# Patient Record
Sex: Female | Born: 1940 | Race: Black or African American | Hispanic: No | Marital: Single | Smoking: Never smoker
Health system: Southern US, Community
[De-identification: ages and names within clinical notes are randomized; demographics above are authoritative.]

## PROBLEM LIST (undated history)

## (undated) DIAGNOSIS — I10 Essential (primary) hypertension: Secondary | ICD-10-CM

## (undated) DIAGNOSIS — M1990 Unspecified osteoarthritis, unspecified site: Secondary | ICD-10-CM

## (undated) DIAGNOSIS — C801 Malignant (primary) neoplasm, unspecified: Secondary | ICD-10-CM

## (undated) DIAGNOSIS — E079 Disorder of thyroid, unspecified: Secondary | ICD-10-CM

## (undated) DIAGNOSIS — D849 Immunodeficiency, unspecified: Secondary | ICD-10-CM

## (undated) DIAGNOSIS — N289 Disorder of kidney and ureter, unspecified: Secondary | ICD-10-CM

## (undated) HISTORY — DX: Unspecified osteoarthritis, unspecified site: M19.90

## (undated) HISTORY — PX: ABDOMINAL HYSTERECTOMY: SHX81

## (undated) HISTORY — PX: OTHER SURGICAL HISTORY: SHX169

---

## 2003-08-09 ENCOUNTER — Other Ambulatory Visit: Payer: Self-pay

## 2004-09-28 ENCOUNTER — Ambulatory Visit: Payer: Self-pay

## 2005-02-01 ENCOUNTER — Other Ambulatory Visit: Payer: Self-pay

## 2005-02-08 ENCOUNTER — Ambulatory Visit: Payer: Self-pay | Admitting: Orthopaedic Surgery

## 2005-02-23 ENCOUNTER — Encounter: Payer: Self-pay | Admitting: Orthopaedic Surgery

## 2005-03-05 ENCOUNTER — Encounter: Payer: Self-pay | Admitting: Orthopaedic Surgery

## 2005-03-27 ENCOUNTER — Ambulatory Visit: Payer: Self-pay | Admitting: Internal Medicine

## 2005-03-29 ENCOUNTER — Ambulatory Visit: Payer: Self-pay | Admitting: Internal Medicine

## 2005-04-05 ENCOUNTER — Encounter: Payer: Self-pay | Admitting: Orthopaedic Surgery

## 2005-05-05 ENCOUNTER — Encounter: Payer: Self-pay | Admitting: Orthopaedic Surgery

## 2005-06-05 ENCOUNTER — Encounter: Payer: Self-pay | Admitting: Orthopaedic Surgery

## 2005-07-06 ENCOUNTER — Encounter: Payer: Self-pay | Admitting: Orthopaedic Surgery

## 2005-07-20 ENCOUNTER — Encounter: Payer: Self-pay | Admitting: Internal Medicine

## 2005-08-03 ENCOUNTER — Encounter: Payer: Self-pay | Admitting: Internal Medicine

## 2005-09-03 ENCOUNTER — Encounter: Payer: Self-pay | Admitting: Internal Medicine

## 2005-10-03 ENCOUNTER — Encounter: Payer: Self-pay | Admitting: Internal Medicine

## 2005-11-03 ENCOUNTER — Encounter: Payer: Self-pay | Admitting: Internal Medicine

## 2005-12-03 ENCOUNTER — Encounter: Payer: Self-pay | Admitting: Internal Medicine

## 2005-12-22 ENCOUNTER — Ambulatory Visit: Payer: Self-pay | Admitting: Urology

## 2006-01-02 ENCOUNTER — Encounter: Payer: Self-pay | Admitting: Urology

## 2006-01-03 ENCOUNTER — Encounter: Payer: Self-pay | Admitting: Urology

## 2006-01-03 ENCOUNTER — Encounter: Payer: Self-pay | Admitting: Internal Medicine

## 2006-02-03 ENCOUNTER — Encounter: Payer: Self-pay | Admitting: Urology

## 2006-02-03 ENCOUNTER — Emergency Department: Payer: Self-pay | Admitting: Emergency Medicine

## 2006-02-03 ENCOUNTER — Other Ambulatory Visit: Payer: Self-pay

## 2006-02-06 ENCOUNTER — Ambulatory Visit: Payer: Self-pay | Admitting: Urology

## 2006-03-01 ENCOUNTER — Other Ambulatory Visit: Payer: Self-pay

## 2006-03-01 ENCOUNTER — Inpatient Hospital Stay: Payer: Self-pay | Admitting: Internal Medicine

## 2006-03-02 ENCOUNTER — Other Ambulatory Visit: Payer: Self-pay

## 2006-03-08 ENCOUNTER — Ambulatory Visit: Payer: Self-pay | Admitting: Cardiovascular Disease

## 2006-03-22 ENCOUNTER — Inpatient Hospital Stay: Payer: Self-pay | Admitting: Internal Medicine

## 2006-03-23 ENCOUNTER — Other Ambulatory Visit: Payer: Self-pay

## 2006-04-30 ENCOUNTER — Ambulatory Visit: Payer: Self-pay | Admitting: Nephrology

## 2006-06-04 ENCOUNTER — Other Ambulatory Visit: Payer: Self-pay

## 2006-06-04 ENCOUNTER — Inpatient Hospital Stay: Payer: Self-pay | Admitting: Internal Medicine

## 2006-06-05 ENCOUNTER — Other Ambulatory Visit: Payer: Self-pay

## 2006-06-06 ENCOUNTER — Other Ambulatory Visit: Payer: Self-pay

## 2006-06-07 ENCOUNTER — Other Ambulatory Visit: Payer: Self-pay

## 2006-07-06 ENCOUNTER — Encounter: Payer: Self-pay | Admitting: Internal Medicine

## 2006-08-23 ENCOUNTER — Encounter: Payer: Self-pay | Admitting: Internal Medicine

## 2006-09-04 ENCOUNTER — Encounter: Payer: Self-pay | Admitting: Internal Medicine

## 2006-10-04 ENCOUNTER — Encounter: Payer: Self-pay | Admitting: Internal Medicine

## 2006-11-04 ENCOUNTER — Encounter: Payer: Self-pay | Admitting: Internal Medicine

## 2006-12-09 ENCOUNTER — Emergency Department: Payer: Self-pay | Admitting: Emergency Medicine

## 2007-04-17 ENCOUNTER — Observation Stay: Payer: Self-pay | Admitting: Internal Medicine

## 2007-04-17 ENCOUNTER — Other Ambulatory Visit: Payer: Self-pay

## 2007-04-18 ENCOUNTER — Other Ambulatory Visit: Payer: Self-pay

## 2007-05-21 ENCOUNTER — Ambulatory Visit: Payer: Self-pay | Admitting: Internal Medicine

## 2007-06-20 ENCOUNTER — Ambulatory Visit: Payer: Self-pay

## 2007-06-21 ENCOUNTER — Ambulatory Visit: Payer: Self-pay

## 2008-05-24 IMAGING — CR DG CHEST 1V PORT
1 series · 1 of 1 positions shown · non-contrast
Comparison: none

REASON FOR EXAM: CP
COMMENTS:

[view not recorded]
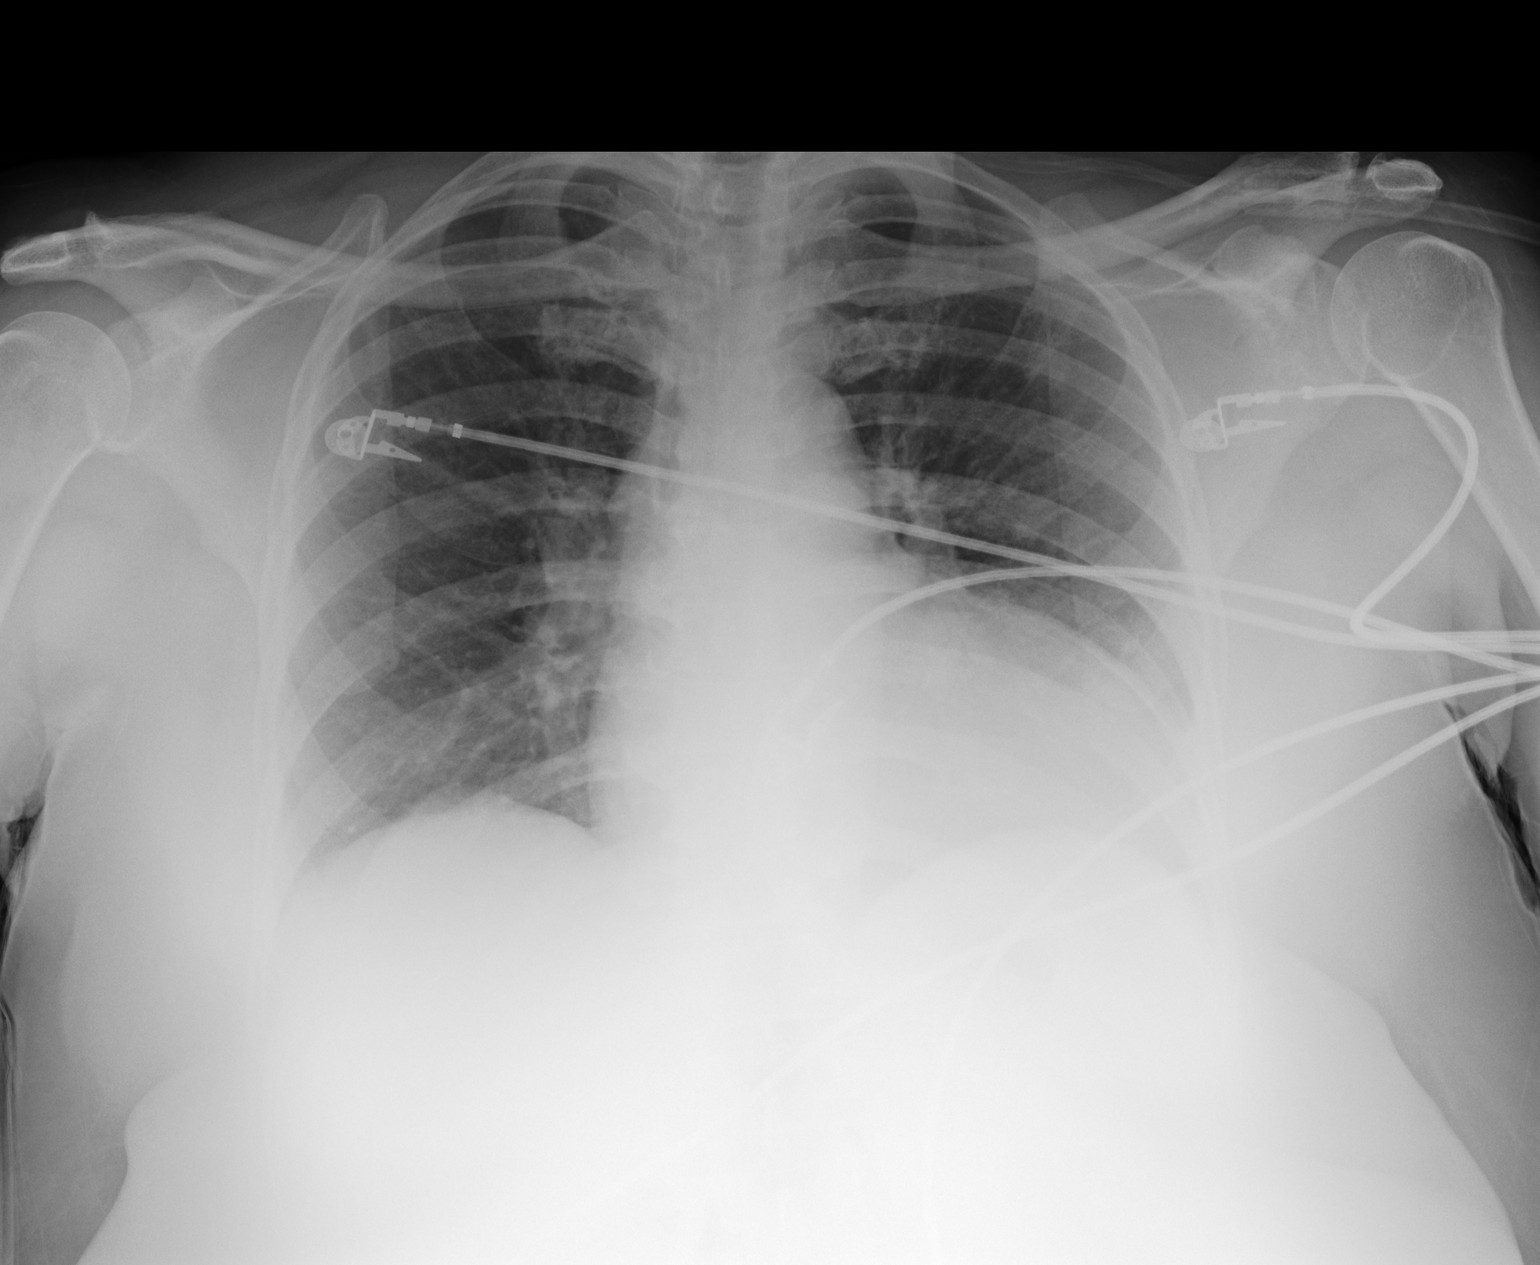

[1 of 1 positions shown; findings below may reference images not displayed]

PROCEDURE:     DXR - DXR PORTABLE CHEST SINGLE VIEW  - March 01, 2006 [DATE]

RESULT:     Portable AP view of the chest is compared to the prior exam of
02-08-05.  The lung fields are clear. No pneumonia, pneumothorax or pleural
effusion is seen.  No pulmonary edema is noted.  The heart is upper limits
to normal in size.  Monitoring electrodes are present.
IMPRESSION: 1)No acute changes are identified.

## 2008-06-18 IMAGING — NM NM LUNG SCAN
2 series · 16 of 16 positions shown · non-contrast
Comparison: none

REASON FOR EXAM: hypoxia
COMMENTS:

PROCEDURE:     NM  - NM VQ LUNG SCAN  - [DATE] [DATE] [DATE]  [DATE]
RESULT:
HISTORY: Hypoxia.
PROCEDURE AND FINDINGS:  Following administration of 37.35 mCi of
technetium-33m DPTA and 4.95 mCi of technetium-33m MAA, ventilation
perfusion scan was obtained.  No definite ventilation perfusion mismatches
are noted.  Chest x-ray is unremarkable except for cardiomegaly.

[Series 1: lung perfusion · 1.95mm/px · 4 acquisitions, 8 frames shown]
[im 1/4]
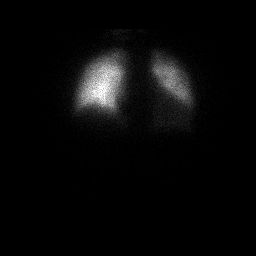
[im 1/4]
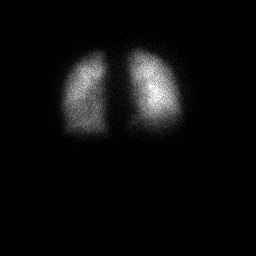
[im 2/4]
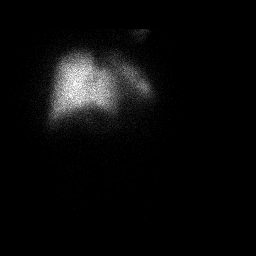
[im 2/4]
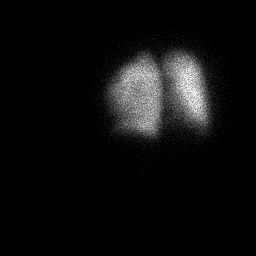
[im 3/4]
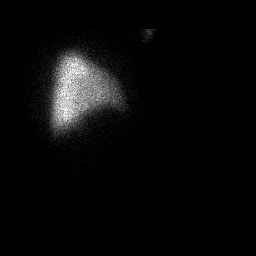
[im 3/4]
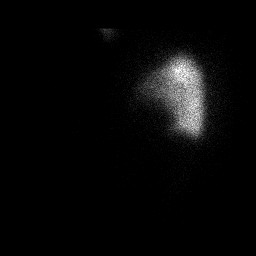
[im 4/4]
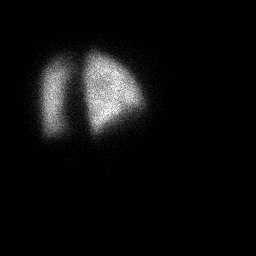
[im 4/4]
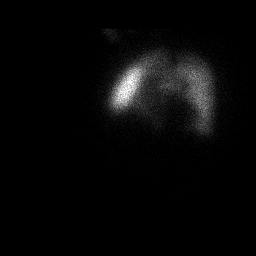

[Series 1: lung ventilation · 3.90mm/px · 4 acquisitions, 8 frames shown]
[im 1/4]
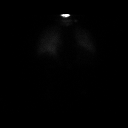
[im 1/4]
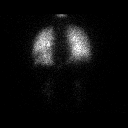
[im 2/4]
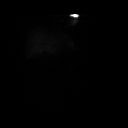
[im 2/4]
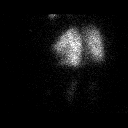
[im 3/4]
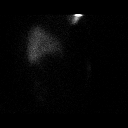
[im 3/4]
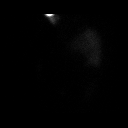
[im 4/4]
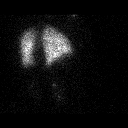
[im 4/4]
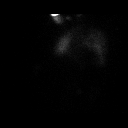

[16 of 16 positions shown; findings below may reference images not displayed]

IMPRESSION: 1)Low probability for pulmonary embolus.

This report was phoned to the patient's nurse. The patient's nurse was
instructed to inform Dr. Stellar.

## 2009-10-22 ENCOUNTER — Observation Stay: Payer: Self-pay | Admitting: Internal Medicine

## 2009-10-22 ENCOUNTER — Ambulatory Visit: Payer: Self-pay | Admitting: Cardiovascular Disease

## 2014-05-16 ENCOUNTER — Emergency Department: Payer: Self-pay | Admitting: Internal Medicine

## 2014-05-16 LAB — CBC
HCT: 33.4 % — ABNORMAL LOW (ref 35.0–47.0)
HGB: 10.9 g/dL — ABNORMAL LOW (ref 12.0–16.0)
MCH: 30.1 pg (ref 26.0–34.0)
MCHC: 32.6 g/dL (ref 32.0–36.0)
MCV: 93 fL (ref 80–100)
Platelet: 162 10*3/uL (ref 150–440)
RBC: 3.61 10*6/uL — ABNORMAL LOW (ref 3.80–5.20)
RDW: 14.1 % (ref 11.5–14.5)
WBC: 8.1 10*3/uL (ref 3.6–11.0)

## 2014-05-16 LAB — COMPREHENSIVE METABOLIC PANEL
ALBUMIN: 3.3 g/dL — AB (ref 3.4–5.0)
ANION GAP: 7 (ref 7–16)
AST: 25 U/L (ref 15–37)
Alkaline Phosphatase: 92 U/L
BUN: 23 mg/dL — ABNORMAL HIGH (ref 7–18)
Bilirubin,Total: 0.3 mg/dL (ref 0.2–1.0)
Calcium, Total: 8.8 mg/dL (ref 8.5–10.1)
Chloride: 104 mmol/L (ref 98–107)
Co2: 27 mmol/L (ref 21–32)
Creatinine: 1.69 mg/dL — ABNORMAL HIGH (ref 0.60–1.30)
EGFR (African American): 38 — ABNORMAL LOW
GFR CALC NON AF AMER: 31 — AB
GLUCOSE: 108 mg/dL — AB (ref 65–99)
Osmolality: 280 (ref 275–301)
Potassium: 4.2 mmol/L (ref 3.5–5.1)
SGPT (ALT): 29 U/L
Sodium: 138 mmol/L (ref 136–145)
Total Protein: 7.5 g/dL (ref 6.4–8.2)

## 2014-05-16 LAB — TROPONIN I: TROPONIN-I: 0.03 ng/mL

## 2014-09-08 ENCOUNTER — Emergency Department: Admit: 2014-09-08 | Disposition: A | Discharge status: Home / Self Care | Payer: Self-pay | Admitting: Student

## 2014-09-08 LAB — COMPREHENSIVE METABOLIC PANEL
ALBUMIN: 4.3 g/dL
ANION GAP: 8 (ref 7–16)
Alkaline Phosphatase: 85 U/L
BUN: 30 mg/dL — ABNORMAL HIGH
Bilirubin,Total: 0.3 mg/dL
CALCIUM: 9.1 mg/dL
CO2: 32 mmol/L
Chloride: 99 mmol/L — ABNORMAL LOW
Creatinine: 1.51 mg/dL — ABNORMAL HIGH
EGFR (Non-African Amer.): 34 — ABNORMAL LOW
GFR CALC AF AMER: 39 — AB
Glucose: 99 mg/dL
POTASSIUM: 3.9 mmol/L
SGOT(AST): 78 U/L — ABNORMAL HIGH
SGPT (ALT): 108 U/L — ABNORMAL HIGH
Sodium: 139 mmol/L
Total Protein: 7.8 g/dL

## 2014-09-08 LAB — CBC
HCT: 36.5 % (ref 35.0–47.0)
HGB: 12.3 g/dL (ref 12.0–16.0)
MCH: 30.2 pg (ref 26.0–34.0)
MCHC: 33.6 g/dL (ref 32.0–36.0)
MCV: 90 fL (ref 80–100)
Platelet: 143 10*3/uL — ABNORMAL LOW (ref 150–440)
RBC: 4.06 10*6/uL (ref 3.80–5.20)
RDW: 14.5 % (ref 11.5–14.5)
WBC: 4 10*3/uL (ref 3.6–11.0)

## 2014-09-08 LAB — URINALYSIS, COMPLETE
BILIRUBIN, UR: NEGATIVE
BLOOD: NEGATIVE
Glucose,UR: NEGATIVE mg/dL (ref 0–75)
KETONE: NEGATIVE
Leukocyte Esterase: NEGATIVE
Nitrite: NEGATIVE
PH: 7 (ref 4.5–8.0)
Protein: NEGATIVE
RBC,UR: NONE SEEN /HPF (ref 0–5)
SPECIFIC GRAVITY: 1.003 (ref 1.003–1.030)
Squamous Epithelial: NONE SEEN
WBC UR: <1 /HPF (ref 0–5)

## 2014-09-08 LAB — PROTIME-INR
INR: 0.9
PROTHROMBIN TIME: 12.8 s

## 2014-09-08 LAB — LIPASE, BLOOD: LIPASE: 76 U/L — AB

## 2014-09-08 LAB — TROPONIN I: Troponin-I: <0.03 ng/mL

## 2014-09-13 LAB — CULTURE, BLOOD (SINGLE)

## 2015-10-01 ENCOUNTER — Encounter: Payer: Self-pay | Admitting: *Deleted

## 2015-10-01 ENCOUNTER — Emergency Department: Payer: Medicare Other

## 2015-10-01 ENCOUNTER — Emergency Department
Admission: EM | Admit: 2015-10-01 | Discharge: 2015-10-02 | Disposition: A | Discharge status: Home / Self Care | Payer: Medicare Other | Attending: Emergency Medicine | Admitting: Emergency Medicine

## 2015-10-01 DIAGNOSIS — I10 Essential (primary) hypertension: Secondary | ICD-10-CM | POA: Insufficient documentation

## 2015-10-01 DIAGNOSIS — M545 Low back pain: Secondary | ICD-10-CM | POA: Diagnosis present

## 2015-10-01 DIAGNOSIS — R52 Pain, unspecified: Secondary | ICD-10-CM

## 2015-10-01 DIAGNOSIS — M5442 Lumbago with sciatica, left side: Secondary | ICD-10-CM | POA: Insufficient documentation

## 2015-10-01 HISTORY — DX: Disorder of kidney and ureter, unspecified: N28.9

## 2015-10-01 HISTORY — DX: Essential (primary) hypertension: I10

## 2015-10-01 HISTORY — DX: Malignant (primary) neoplasm, unspecified: C80.1

## 2015-10-01 HISTORY — DX: Immunodeficiency, unspecified: D84.9

## 2015-10-01 HISTORY — DX: Disorder of thyroid, unspecified: E07.9

## 2015-10-01 LAB — CBC WITH DIFFERENTIAL/PLATELET
BASOS ABS: 0 10*3/uL (ref 0–0.1)
BASOS PCT: 1 %
Eosinophils Absolute: 0 10*3/uL (ref 0–0.7)
Eosinophils Relative: 0 %
HEMATOCRIT: 33 % — AB (ref 35.0–47.0)
HEMOGLOBIN: 11.1 g/dL — AB (ref 12.0–16.0)
Lymphocytes Relative: 29 %
Lymphs Abs: 1.4 10*3/uL (ref 1.0–3.6)
MCH: 30.4 pg (ref 26.0–34.0)
MCHC: 33.5 g/dL (ref 32.0–36.0)
MCV: 90.7 fL (ref 80.0–100.0)
Monocytes Absolute: 0.7 10*3/uL (ref 0.2–0.9)
Monocytes Relative: 16 %
NEUTROS ABS: 2.6 10*3/uL (ref 1.4–6.5)
NEUTROS PCT: 54 %
Platelets: 152 10*3/uL (ref 150–440)
RBC: 3.64 MIL/uL — ABNORMAL LOW (ref 3.80–5.20)
RDW: 14.5 % (ref 11.5–14.5)
WBC: 4.7 10*3/uL (ref 3.6–11.0)

## 2015-10-01 MED ORDER — ACETAMINOPHEN 325 MG PO TABS
650.0000 mg | ORAL_TABLET | Freq: Once | ORAL | Status: AC
  Administered 2015-10-01: 650 mg via ORAL
  Filled 2015-10-01: qty 2

## 2015-10-01 MED ORDER — HYDROMORPHONE HCL 1 MG/ML IJ SOLN
0.5000 mg | Freq: Once | INTRAMUSCULAR | Status: AC
  Administered 2015-10-01: 0.5 mg via INTRAVENOUS
  Filled 2015-10-01: qty 1

## 2015-10-01 MED ORDER — ONDANSETRON HCL 4 MG/2ML IJ SOLN
4.0000 mg | Freq: Once | INTRAMUSCULAR | Status: AC
  Administered 2015-10-01: 4 mg via INTRAVENOUS
  Filled 2015-10-01: qty 2

## 2015-10-01 NOTE — ED Notes (Signed)
Pt c/o L lower back and L hip pain radiating down L leg. Pt states worsening over past 4 days. Pt states hx of sciatic pain once prior. Pt states unable to take NSAID's secondary to kidney disease. Pt c/o swelling in L ankle and lower leg. Pt denies relief from tylenol at this time. Pt states unable to lie down and sleep due to pain.

## 2015-10-01 NOTE — ED Provider Notes (Signed)
[facility] Emergency Department Provider Note  ____________________________________________  Time seen: Approximately 10:45 PM  I have reviewed the triage vital signs and the nursing notes.   HISTORY  Chief Complaint Back Pain    HPI Stacey Hayes is a 75 y.o. female who reports she's had lower back pain in the past she's had twinges for several weeks but in the last approximately week she's had constant low back pain radiating down the left leg. Last 2 days has been very severe and she is having trouble moving the left leg out of the bed when she gets up in the morning.   Past Medical History  Diagnosis Date  . Renal disorder   . Hypertension   . Cancer ([place])   . Thyroid disease   . Immune deficiency disorder ([place])     There are no active problems to display for this patient.   Past Surgical History  Procedure Laterality Date  . Abdominal hysterectomy    . Lung cyst Left     Current Outpatient Rx  Name  Route  Sig  Dispense  Refill  . HYDROcodone-acetaminophen (NORCO) 5-325 MG tablet   Oral   Take 1-2 tablets by mouth every 6 (six) hours as needed for moderate pain (Start with one tablet if that does not work after half an hour he can take a second tablet and then due to tablets every 6 hours).   6 tablet   0     Allergies Beta adrenergic blockers  History reviewed. No pertinent family history.  Social History Social History  Substance Use Topics  . Smoking status: Never Smoker   . Smokeless tobacco: None  . Alcohol Use: No    Review of Systems  Constitutional: No fever/chills Eyes: No visual changes. ENT: No sore throat. Cardiovascular: Denies chest pain. Respiratory: Denies shortness of breath. Gastrointestinal: No abdominal pain.  No nausea, no vomiting.  No diarrhea.  No constipation. Genitourinary: Negative for dysuria. Musculoskeletal: Negative for back pain. Skin: Negative for rash. Neurological: Negative for  headaches, focal weakness   10-point ROS otherwise negative.  ____________________________________________   PHYSICAL EXAM:  VITAL SIGNS: ED Triage Vitals  Enc Vitals Group     BP 10/01/15 2119 141/73 mmHg     Pulse Rate 10/01/15 2119 77     Resp 10/01/15 2119 20     Temp 10/01/15 2119 98.4 F (36.9 C)     Temp Source 10/01/15 2119 Oral     SpO2 10/01/15 2119 99 %     Weight 10/01/15 2119 197 lb (89.359 kg)     Height 10/01/15 2119 5\' 5"  (1.651 m)     Head Cir --      Peak Flow --      Pain Score 10/01/15 2120 9     Pain Loc --      Pain Edu? --      Excl. in [place]? --     Constitutional: Alert and oriented. Well appearing and in no acute distress. Eyes: Conjunctivae are normal. PERRL. EOMI. Head: Atraumatic. Nose: No congestion/rhinnorhea. Mouth/Throat: Mucous membranes are moist.  Oropharynx non-erythematous. Neck: No stridor.   Cardiovascular: Normal rate, regular rhythm. Grossly normal heart sounds.  Good peripheral circulation. Respiratory: Normal respiratory effort.  No retractions. Lungs CTAB. Gastrointestinal: Soft and nontender. No distention. No abdominal bruits. No CVA tenderness. Musculoskeletal: No lower extremity tenderness nor edema.  No joint effusions. Neurologic:  Normal speech and language. NoDefinite gross focal neurologic deficits are appreciated.  Skin:  Skin is warm, dry and intact. No rash noted. Psychiatric: Mood and affect are normal. Speech and behavior are normal.  ____________________________________________   LABS (all labs ordered are listed, but only abnormal results are displayed)  Labs Reviewed  COMPREHENSIVE METABOLIC PANEL - Abnormal; Notable for the following:    Glucose, Bld 132 (*)    BUN 30 (*)    Creatinine, Ser 1.91 (*)    GFR calc non Af Amer 25 (*)    GFR calc Af Amer 28 (*)    All other components within normal limits  CBC WITH DIFFERENTIAL/PLATELET - Abnormal; Notable for the following:    RBC 3.64 (*)    Hemoglobin  11.1 (*)    HCT 33.0 (*)    All other components within normal limits  URINALYSIS COMPLETEWITH MICROSCOPIC ([facility] ONLY)   ____________________________________________  EKG   ____________________________________________  RADIOLOGY  CLINICAL DATA: Low back pain radiating to LEFT lower extremity, worsening for 4 days. History of hypertension, renal disorder, immune deficiency disorder.  EXAM: MRI LUMBAR SPINE WITHOUT CONTRAST  TECHNIQUE: Multiplanar, multisequence MR imaging of the lumbar spine was performed. No intravenous contrast was administered.  COMPARISON: CT abdomen and pelvis September 08, 2014  FINDINGS: Lumbar vertebral bodies intact. Using the reference level of the last well-formed intervertebral disc as L5-S1, stable grade 1 L4-5 anterolisthesis. No spondylolysis. Maintenance of lumbar lordosis. Mild L3-4 through L5-S1 disc height loss, with decreased T2 signal within all lumbar disc compatible with mild desiccation. Moderate chronic discogenic endplate changes [id number]. No STIR signal abnormality to suggest acute osseous process.  Conus medullaris terminates at L1-2 and appears normal morphology and signal characteristics. Cauda equina is normal. Included prevertebral and paraspinal soft tissues are nonsuspicious. Partially imaged cyst RIGHT kidney better characterized on recent CT.  Level by level evaluation:  T12-L1: No disc bulge, canal stenosis or neural foraminal narrowing. Moderate RIGHT facet arthropathy.  L1-2: No disc bulge. Mild to moderate LEFT greater than RIGHT facet arthropathy without canal stenosis or neural foraminal narrowing.  L2-3: No disc bulge. Moderate facet arthropathy and ligamentum flavum redundancy without canal stenosis or neural foraminal narrowing.  L3-4: Small broad-based disc bulge. Moderate to severe facet arthropathy and ligamentum flavum redundancy result in mild canal stenosis. Mild RIGHT neural foraminal  narrowing.  L4-5: Anterolisthesis. Small broad-based disc bulge. Moderate facet arthropathy and ligamentum flavum redundancy. Mild canal stenosis. No significant neural foraminal narrowing.  L5-S1: Moderate broad-based disc bulge. Mild facet arthropathy and ligamentum flavum redundancy. No canal stenosis. Moderate RIGHT, moderate to severe LEFT neural foraminal narrowing.  IMPRESSION: No acute process of the lumbar spine.  Stable grade 1 L4-5 anterolisthesis on degenerative basis.  Mild canal stenosis L3-4 and L4-5. Neural foraminal narrowing L3-4 and L5-S1: Moderate to severe on the LEFT at L5-S1.   Electronically Signed  By: Elon Alas M.D.  On: 10/02/2015 00:30       ____________________________________________   PROCEDURES    ____________________________________________   INITIAL IMPRESSION / ASSESSMENT AND PLAN / ED COURSE  Pertinent labs & imaging results that were available during my care of the patient were reviewed by me and considered in my medical decision making (see chart for details).  Oral instructions include be careful for constipation and weakness or dizziness from the medicine do not fall. ____________________________________________   FINAL CLINICAL IMPRESSION(S) / ED DIAGNOSES  Final diagnoses:  Pain  Left-sided low back pain with left-sided sciatica      Nena Polio, MD 10/02/15 0127

## 2015-10-01 NOTE — ED Notes (Addendum)
Patient transported to MRI 

## 2015-10-02 LAB — COMPREHENSIVE METABOLIC PANEL
ALBUMIN: 3.9 g/dL (ref 3.5–5.0)
ALT: 17 U/L (ref 14–54)
AST: 23 U/L (ref 15–41)
Alkaline Phosphatase: 76 U/L (ref 38–126)
Anion gap: 13 (ref 5–15)
BUN: 30 mg/dL — ABNORMAL HIGH (ref 6–20)
CHLORIDE: 101 mmol/L (ref 101–111)
CO2: 25 mmol/L (ref 22–32)
CREATININE: 1.91 mg/dL — AB (ref 0.44–1.00)
Calcium: 9.1 mg/dL (ref 8.9–10.3)
GFR calc non Af Amer: 25 mL/min — ABNORMAL LOW (ref >60)
GFR, EST AFRICAN AMERICAN: 28 mL/min — AB (ref >60)
Glucose, Bld: 132 mg/dL — ABNORMAL HIGH (ref 65–99)
Potassium: 4.5 mmol/L (ref 3.5–5.1)
SODIUM: 139 mmol/L (ref 135–145)
Total Bilirubin: 0.5 mg/dL (ref 0.3–1.2)
Total Protein: 7.1 g/dL (ref 6.5–8.1)

## 2015-10-02 MED ORDER — HYDROCODONE-ACETAMINOPHEN 5-325 MG PO TABS: 1.0000 | ORAL_TABLET | Freq: Four times a day (QID) | ORAL | Status: AC | PRN

## 2015-10-02 NOTE — ED Notes (Signed)
Patient transported returned MRI

## 2015-10-02 NOTE — ED Notes (Signed)
PIV DC'D, CATH INTACT.

## 2015-11-09 ENCOUNTER — Encounter: Payer: Self-pay | Admitting: Physical Therapy

## 2015-11-09 ENCOUNTER — Ambulatory Visit: Payer: Medicare Other | Attending: Geriatric Medicine | Admitting: Physical Therapy

## 2015-11-09 DIAGNOSIS — R262 Difficulty in walking, not elsewhere classified: Secondary | ICD-10-CM

## 2015-11-09 DIAGNOSIS — M6281 Muscle weakness (generalized): Secondary | ICD-10-CM

## 2015-11-09 DIAGNOSIS — R293 Abnormal posture: Secondary | ICD-10-CM | POA: Diagnosis present

## 2015-11-09 DIAGNOSIS — M5442 Lumbago with sciatica, left side: Secondary | ICD-10-CM | POA: Diagnosis present

## 2015-11-09 NOTE — Therapy (Signed)
[place] MAIN [facility] SERVICES [address] [place], [place], [address] Phone: [phone]   Fax:  [phone]  Physical Therapy Evaluation  Patient Details  Name: Stacey Hayes MRN: [id number] Date of Birth: [birth year] Referring Provider: Johny Blamer  Encounter Date: 11/09/2015      PT End of Session - 11/09/15 1538    Visit Number 1   Number of Visits 13   Date for PT Re-Evaluation 12/21/15   Authorization Type G code 1    Authorization Time Period 10   PT Start Time 1300   PT Stop Time 1402   PT Time Calculation (min) 62 min   Activity Tolerance Patient tolerated treatment well;No increased pain   Behavior During Therapy [facility] for tasks assessed/performed      Past Medical History  Diagnosis Date  . Renal disorder   . Thyroid disease   . Immune deficiency disorder ([place])   . Cancer ([place])     5-6 years previous, lung cancer  . Hypertension     fluctuating, treated with meds  . Arthritis     knees    Past Surgical History  Procedure Laterality Date  . Abdominal hysterectomy    . Lung cyst Left     There were no vitals filed for this visit.       Subjective Assessment - 11/09/15 1308    Subjective Patient reports falling backwards in April and that LBP began 3 weeks after fall. Patient reports having difficulties getting out of bed with pain in lower back and radiating anteriorly to BLE, all the way to LLE foot, towards ankle on RLE.Marland Kitchen Patient reports inability to walk and lay supine 3 weeks after fall resulting in an ED visit.    Pertinent History Personal factors affecting rehab: Living alone, high fall risk, chronic back pain, arthritis, no railing on step to enter home,    Limitations Standing;Walking;Sitting   How long can you sit comfortably? 30 min.; if for an hour have problems moving afterwards   How long can you walk comfortably? reports not leaving the house unless she absolutely has to    Diagnostic tests MRI  Mild canal stenosis L3-4 and L4-5. Neural foraminal narrowing L3-4 and L5-S1: Moderate to severe on the LEFT at L5-S1.   Patient Stated Goals Wants to be able to travel near/far distances such as being able to sit in car without increase in aggravating symptoms;    Currently in Pain? Yes   Pain Score 6    Pain Location Back   Pain Orientation Left;Lower   Pain Descriptors / Indicators Throbbing;Nagging   Pain Type Chronic pain   Pain Radiating Towards BLE (L>R)   Pain Onset More than a month ago   Pain Frequency Intermittent   Aggravating Factors  being stationary, lying down, standing   Pain Relieving Factors Ice, movement helps            [facility] PT Assessment - 11/09/15 0001    Assessment   Medical Diagnosis Low Back Pain with LLE radiculopathy   Referring Provider Joella Prince, John   Onset Date/Surgical Date 09/04/15   Hand Dominance Right   Next MD Visit August   Prior Therapy had PT years ago for back pain with good results, no recent therapy for this condition   Precautions   Precautions Fall   Restrictions   Weight Bearing Restrictions No   Balance Screen   Has the patient fallen in the past 6 months Yes  How many times? 1   Has the patient had a decrease in activity level because of a fear of falling?  Yes   Is the patient reluctant to leave their home because of a fear of falling?  No   Home Environment   Additional Comments 2 story home, lives alone, 1 step in back with no railings, bedroom and bathroom on main level, steps in the house which patient reports using only on "good days", no grab bars in the tub, elevated toilet seat, still driving, does some cooking and light house work   Prior Function   Level of Interior and spatial designer;Independent with gait;Independent with transfers   Vocation Retired   [employer], involved in a Seniors group, social interactions, cooking   Cognition   Overall Cognitive Status Within Functional Limits for tasks assessed    Observation/Other Assessments   Observations Flexion increases pain and radiates into L hip and waistline; Lateral flexion limited (L>R), increased pain with back extension. Pain on L/R rotation with >increase rotating to the L.Repeated extensions in prone seemed to decrease LLE pain but increased lumbar back pain.   Sensation   Light Touch Appears Intact   Additional Comments denies N/T   Coordination   Gross Motor Movements are Fluid and Coordinated Yes   Fine Motor Movements are Fluid and Coordinated Yes   Posture/Postural Control   Posture Comments Sitting-slumped with decreased thoracic kyphosis, forward flexed in sitting and standing, exhibits less lumbar lordosis    AROM   Lumbar Flexion 45   Lumbar Extension 15   Lumbar - Right Side Bend 5   Lumbar - Left Side Bend 10   Strength   Right Hip Flexion 3/5   Right Hip Extension 2/5   Left Hip Flexion 3/5   Left Hip Extension 2/5   Right Knee Flexion 4-/5   Right Knee Extension 4/5   Left Knee Flexion 3+/5   Left Knee Extension 3+/5   Right Ankle Dorsiflexion 4/5   Left Ankle Dorsiflexion 4/5   Palpation   Spinal mobility Grossly hypomobile L1-L5 with centralized point tenderness     Palpation comment Tender to palpation in center of back (L1-5), increased pain in LLE with palpation of L lower back (multifidus region); piriformis palpation: trigger point on L, tightness over L paraspinals and L multifidus   Slump test   Findings Positive   Comment BLE (L>R)   Prone Knee Bend Test   Findings Positive   Comment BLE   Transfers   Comments Uses arm rests   Ambulation/Gait   Gait Comments Ambulate without AD, demonstrates decreased trunk rotation, arm swing and knee flexion BLE. slow gait speed   High Level Balance   High Level Balance Comments Able to stand unsupported, Good -        Treatment  HEP Initated Prone lumbar extensions 3 sets x 10 reps  Standing lumbar extension with wall assist 1 set x 10  reps Instruction on sitting posture with lumbar roll                     PT Education - 11/09/15 1537    Education provided Yes   Education Details HEP initiated, educated patient on extension, posture, lumbar roll    Person(s) Educated Patient   Methods Explanation;Demonstration;Verbal cues;Handout   Comprehension Verbalized understanding;Returned demonstration             PT Long Term Goals - 11/09/15 1726    PT LONG TERM  GOAL #1   Title Pt will report worst pain of 3/10 in low back to increase functional activites    Time 6   Period Weeks   Status New   PT LONG TERM GOAL #2   Title Pt will be indep picking up object off the floor with good body mechanics and without an increase in pain    Time 6   Period Weeks   Status New   PT LONG TERM GOAL #3   Title Pt will report decreased modified oswestry score to less than 20 percent to improve functional mobility    Time 6   Period Weeks   Status New   PT LONG TERM GOAL #4   Title Pt will be able to ride in car for 2+ hours without increase in pain in order to travel to see family    Time 6   Period Weeks   Status New   PT LONG TERM GOAL #5   Title Pt will increase BLE grossly to 4/5 without increase in pain for easier sit to stand transfer    Time 6   Period Weeks   Status New               Plan - 11/09/15 1541    Clinical Impression Statement Pt is 75 year old female with chronic low back pain radiating to LLE moreso than RLE after fall in April.  Pt experiences increased LBP after sitting for long periods of time, standing for long periods of time or bending over to pick things off the floor.  She lives alone in a two  story home but resides on the first floor. The back pain has limited her community activity. MRI shows Mild canal stenosis L3-4 and L4-5. Neural foraminal narrowing L3-4 and L5-S1: Moderate to severe on the LEFT at L5-S1. Pt has limited lumbar ROM in all directions which results in  increased pain.  Pt is tender over spinous processes of L1-L5 with tightness over L paraspinals, multifidus, and pirformis.  She reported decreased LLE pain and more centralized LBP with repeated extensions.  She would benefit from further skilled physical therapy to decrease LBP, increase ROM, and stability and strength of low back.     Rehab Potential Fair   Clinical Impairments Affecting Rehab Potential Positive indicator: motivated, negative indicator: age, comorbidites, chronic back pain, radicular symptoms, fall risk, clinical presentation: evolving-due to radicular symptoms related to back pain    PT Frequency 2x / week   PT Duration 6 weeks   PT Treatment/Interventions ADLs/Self Care Home Management;Cryotherapy;Electrical Stimulation;Moist Heat;Traction;Ultrasound;Stair training;Functional mobility training;Gait training;Therapeutic exercise;Therapeutic activities;Balance training;Neuromuscular re-education;Patient/family education;Manual techniques;Energy conservation;Aquatic Therapy   PT Next Visit Plan modifed oswestry, lumbar ROM, strengthening    PT Home Exercise Plan initated, see patient instructions    Consulted and Agree with Plan of Care Patient      Patient will benefit from skilled therapeutic intervention in order to improve the following deficits and impairments:  Pain, Hypomobility, Decreased endurance, Decreased balance, Decreased activity tolerance, Decreased strength, Decreased range of motion, Decreased mobility, Postural dysfunction, Abnormal gait, Improper body mechanics, Decreased safety awareness  Visit Diagnosis: Bilateral low back pain with left-sided sciatica - Plan: PT plan of care cert/re-cert  Muscle weakness (generalized) - Plan: PT plan of care cert/re-cert  Difficulty in walking, not elsewhere classified - Plan: PT plan of care cert/re-cert  Abnormal posture - Plan: PT plan of care cert/re-cert      G-Codes - [id number]  Functional Assessment  Tool Used strength, ROM, clinical judgement    Functional Limitation Mobility: Walking and moving around   Mobility: Walking and Moving Around Current Status [phone]) At least 40 percent but less than 60 percent impaired, limited or restricted   Mobility: Walking and Moving Around Goal Status [phone]) At least 20 percent but less than 40 percent impaired, limited or restricted       Problem List There are no active problems to display for this patient.   Lexa Coronado, PT, DPT 11/09/2015, 5:40 PM  [place] MAIN [facility] SERVICES [address] [place], [place], [address] Phone: [phone]   Fax:  [phone]  Name: Stacey Hayes MRN: [id number] Date of Birth: 1941/01/30

## 2015-11-09 NOTE — Patient Instructions (Signed)
On Elbows (Prone)    Rise up on elbows as high as possible, keeping hips on floor. Hold _2___ seconds. Repeat __10__ times per set. Do __2__ sets per session. Do __every hour as tolerated;  http://orth.exer.us/93   Copyright  VHI. All rights reserved.  Extension: Lumbar - Standing (Belt)    Standing, arch backwards (either facing a wall or hands on hips)Stay in pain free range. Repeat _10___ times per set. Do __2__ sets per session. Do __every hour as available.  Copyright  VHI. All rights reserved.  Posture - Sitting    Sit upright, head facing forward. Try using a roll to support lower back. Keep shoulders relaxed, and avoid rounded back. Keep hips level with knees. Avoid crossing legs for long periods. Put a towel roll against low back for good posture;   Copyright  VHI. All rights reserved.

## 2015-11-11 ENCOUNTER — Encounter: Payer: Self-pay | Admitting: Physical Therapy

## 2015-11-11 ENCOUNTER — Ambulatory Visit: Payer: Medicare Other | Admitting: Physical Therapy

## 2015-11-11 DIAGNOSIS — M5442 Lumbago with sciatica, left side: Secondary | ICD-10-CM

## 2015-11-11 DIAGNOSIS — R293 Abnormal posture: Secondary | ICD-10-CM

## 2015-11-11 DIAGNOSIS — M6281 Muscle weakness (generalized): Secondary | ICD-10-CM

## 2015-11-11 DIAGNOSIS — R262 Difficulty in walking, not elsewhere classified: Secondary | ICD-10-CM

## 2015-11-11 NOTE — Therapy (Signed)
[place] MAIN [facility] SERVICES [address] [place], [place], [address] Phone: [phone]   Fax:  [phone]  Physical Therapy Treatment  Patient Details  Name: Stacey Hayes MRN: [id number] Date of Birth: Jan 17, 1941 Referring Provider: Johny Blamer  Encounter Date: 11/11/2015      PT End of Session - 11/11/15 1406    Visit Number 2   Number of Visits 13   Date for PT Re-Evaluation 12/21/15   Authorization Type G code 2   Authorization Time Period 10   PT Start Time [id number]   PT Stop Time [id number]   PT Time Calculation (min) 50 min   Activity Tolerance No increased pain;Patient limited by pain   Behavior During Therapy [facility] for tasks assessed/performed;Anxious      Past Medical History  Diagnosis Date  . Renal disorder   . Thyroid disease   . Immune deficiency disorder ([place])   . Cancer ([place])     5-6 years previous, lung cancer  . Hypertension     fluctuating, treated with meds  . Arthritis     knees    Past Surgical History  Procedure Laterality Date  . Abdominal hysterectomy    . Lung cyst Left     There were no vitals filed for this visit.      Subjective Assessment - 11/11/15 1316    Subjective Pt reports her whole body is sore today, and that her back was 9/10 this morning and closer to 7/10 at the beginning of treatment.  Pt reports doing prone press ups at home but unable to complete them every hour.  Pt also reported that she did not go to choir practice this morning due to her back pain   Pertinent History Personal factors affecting rehab: Living alone, high fall risk, chronic back pain, arthritis, no railing on step to enter home,    Limitations Standing;Walking;Sitting   How long can you sit comfortably? 30 min.; if for an hour have problems moving afterwards   How long can you walk comfortably? reports not leaving the house unless she absolutely has to    Diagnostic tests MRI Mild canal stenosis L3-4 and L4-5. Neural  foraminal narrowing L3-4 and L5-S1: Moderate to severe on the LEFT at L5-S1.   Patient Stated Goals Wants to be able to travel near/far distances such as being able to sit in car without increase in aggravating symptoms;    Currently in Pain? Yes   Pain Score 7    Pain Location Back   Pain Orientation Left;Mid   Pain Descriptors / Indicators Tightness;Sore   Pain Type Chronic pain   Pain Onset More than a month ago            [facility] PT Assessment - 11/11/15 0001    Observation/Other Assessments   Modified Oswertry 56%-severe disability, higher percentage indicates greater disability       Began session with pt completing Modified Oswestry outcome measure   Treatment   Prone press ups 4 sets x 10 reps, 3rd and 4th set on hands rather than elbow, with increased VCs to increase lumbar extension   Grade 2 PA's to L1-L5 for  3 second bouts at each segment for 10 mins. Pt demonstrated muscle guarding and hypomobility . Pt noted soreness but not pain.   Attempted long axis manual distraction to L LE for 10 seconds for treatment of pain relief but patient was unable to tolerate with increased pain after 10 seconds.  Also attempted supine sciatic nerve glide, pt presented with muscle guarding and complaints of increased pain. Distraction and nerve glides deferred.     Lumbar rotations x 30 to the R and back to midline with min VCs for greater ROM   Transverse Abdominis Marches x 20 with mod VCs for core stabilization and L hip flexion range   Standing hip extension with support of counter x 10 reps with min VCs for knee extension and upright posture   Nustep for 4 mins with hot pack application (untimed charge)   Reviewed HEP to continue prone press ups, initiated lumbar rotations to the R, and standing hip extensions with counter support.  Pt expressed verbal understanding and proper demonstration                        PT Education - 11/11/15 1403    Education  provided Yes   Education Details HEP, posture, positioning for prone press ups, walking and completing HEP daily    Person(s) Educated Patient   Methods Explanation;Demonstration;Verbal cues;Handout   Comprehension Verbalized understanding;Verbal cues required             PT Long Term Goals - 11/09/15 1726    PT LONG TERM GOAL #1   Title Pt will report worst pain of 3/10 in low back to increase functional activites    Time 6   Period Weeks   Status New   PT LONG TERM GOAL #2   Title Pt will be indep picking up object off the floor with good body mechanics and without an increase in pain    Time 6   Period Weeks   Status New   PT LONG TERM GOAL #3   Title Pt will report decreased modified oswestry score to less than 20 percent to improve functional mobility    Time 6   Period Weeks   Status New   PT LONG TERM GOAL #4   Title Pt will be able to ride in car for 2+ hours without increase in pain in order to travel to see family    Time 6   Period Weeks   Status New   PT LONG TERM GOAL #5   Title Pt will increase BLE grossly to 4/5 without increase in pain for easier sit to stand transfer    Time 6   Period Weeks   Status New               Plan - 11/11/15 1407    Clinical Impression Statement Pt continues to report back pain and pain into her L posterior thigh that is limiting her community activity.  She reports some relief with prone press ups but notes soreness in lower back and arms.  Pt reported pressure rather than pain in posterior L thigh after prone press ups.  Pts pain was aggravated by sciatic nerve glides, PA's to lumbar spine, and manual traction of LLE.  Pt seemed to have some relief with lumbar rotations in hooklying and tranverse abdominis marches.  Pt was able to perform standing hip extensions with min verbal cues for posture and knee extension throughout.  Pt would continue to benefit from skilled physical therapy to decrease pain, decrease hypomobility  and increase strengthening of core and lumbar musculature.     Rehab Potential Fair   Clinical Impairments Affecting Rehab Potential Positive indicator: motivated, negative indicator: age, comorbidites, chronic back pain, radicular symptoms, fall risk, clinical presentation: evolving-due to  radicular symptoms related to back pain    PT Frequency 2x / week   PT Duration 6 weeks   PT Treatment/Interventions ADLs/Self Care Home Management;Cryotherapy;Electrical Stimulation;Moist Heat;Traction;Ultrasound;Stair training;Functional mobility training;Gait training;Therapeutic exercise;Therapeutic activities;Balance training;Neuromuscular re-education;Patient/family education;Manual techniques;Energy conservation;Aquatic Therapy   PT Next Visit Plan Nustep, review of HEP, core strengthening, extension preference exercises    PT Home Exercise Plan continued, see patient instructions    Consulted and Agree with Plan of Care Patient      Patient will benefit from skilled therapeutic intervention in order to improve the following deficits and impairments:  Pain, Hypomobility, Decreased endurance, Decreased balance, Decreased activity tolerance, Decreased strength, Decreased range of motion, Decreased mobility, Postural dysfunction, Abnormal gait, Improper body mechanics, Decreased safety awareness  Visit Diagnosis: Bilateral low back pain with left-sided sciatica  Muscle weakness (generalized)  Abnormal posture  Difficulty in walking, not elsewhere classified     Problem List There are no active problems to display for this patient.  Stacy Gardner, SPT  Trotter,Margaret PT, DPT 11/11/2015, 4:48 PM  [place] MAIN [facility] SERVICES [address] [place], [place], [address] Phone: [phone]   Fax:  [phone]  Name: Stacey Hayes MRN: [id number] Date of Birth: [birth year]

## 2015-11-11 NOTE — Patient Instructions (Addendum)
   Copyright  VHI. All rights reserved.   Lower Trunk Rotation Stretch  Lying on back with knees bent, Keeping back flat and feet together, rotate knees side to side slowly and in pain free range of motion. (ROTATE TO RIGHT SIDE AND THEN BACK TO MIDDLE. AVOID GOING TO LEFT SIDE)Hold _2___ seconds. Repeat for 1-2 minutes. Do __1__ sets per session. Do __2-3__ sessions per day.   Copyright  VHI. All rights reserved.   EXTENSION: Standing (Active)    Stand, both feet flat. Draw right leg behind body as far as possible. Use __0_ lbs. Complete __2_ sets of _10__ repetitions. Perform __2_ sessions per day.  http://gtsc.exer.us/77   Copyright  VHI. All rights reserved.  Back Hyperextension: Using Arms    Lying face down with arms bent, inhale. Then while exhaling, straighten arms. (push through hands to increase ease of pushing up) Hold __2__ seconds. Slowly return to starting position. Repeat _10___ times per set. Do _2___ sets per session. Do __2__ sessions per day.  Copyright  VHI. All rights reserved.

## 2015-11-15 ENCOUNTER — Ambulatory Visit: Payer: Medicare Other | Admitting: Physical Therapy

## 2015-11-18 ENCOUNTER — Encounter: Payer: Self-pay | Admitting: Physical Therapy

## 2015-11-18 ENCOUNTER — Ambulatory Visit: Payer: Medicare Other | Admitting: Physical Therapy

## 2015-11-18 DIAGNOSIS — R262 Difficulty in walking, not elsewhere classified: Secondary | ICD-10-CM

## 2015-11-18 DIAGNOSIS — M5442 Lumbago with sciatica, left side: Secondary | ICD-10-CM

## 2015-11-18 DIAGNOSIS — M6281 Muscle weakness (generalized): Secondary | ICD-10-CM

## 2015-11-18 DIAGNOSIS — R293 Abnormal posture: Secondary | ICD-10-CM

## 2015-11-18 NOTE — Therapy (Signed)
[place] MAIN [facility] SERVICES [address] [place], [place], [address] Phone: [phone]   Fax:  [phone]  Physical Therapy Treatment  Patient Details  Name: Stacey Hayes MRN: [id number] Date of Birth: 1941-01-08 Referring Provider: Johny Blamer  Encounter Date: 11/18/2015      PT End of Session - 11/18/15 1556    Visit Number 3   Number of Visits 13   Date for PT Re-Evaluation 12/21/15   Authorization Type G code 3   Authorization Time Period 10   PT Start Time [id number]   PT Stop Time [id number]   PT Time Calculation (min) 38 min   Activity Tolerance No increased pain;Patient limited by pain   Behavior During Therapy [facility] for tasks assessed/performed;Anxious      Past Medical History  Diagnosis Date  . Renal disorder   . Thyroid disease   . Immune deficiency disorder ([place])   . Cancer ([place])     5-6 years previous, lung cancer  . Hypertension     fluctuating, treated with meds  . Arthritis     knees    Past Surgical History  Procedure Laterality Date  . Abdominal hysterectomy    . Lung cyst Left     There were no vitals filed for this visit.      Subjective Assessment - 11/18/15 1553    Subjective Pt reports pain in her neck and knees today and that her back was actually feeling ok.  She reports falling on the cement in the dark last night after being locked out of her house, as a result her knees were scraped up and her neck is sore.     Pertinent History Personal factors affecting rehab: Living alone, high fall risk, chronic back pain, arthritis, no railing on step to enter home,    Limitations Standing;Walking;Sitting   How long can you sit comfortably? 30 min.; if for an hour have problems moving afterwards   How long can you walk comfortably? reports not leaving the house unless she absolutely has to    Diagnostic tests MRI Mild canal stenosis L3-4 and L4-5. Neural foraminal narrowing L3-4 and L5-S1: Moderate to severe on  the LEFT at L5-S1.   Patient Stated Goals Wants to be able to travel near/far distances such as being able to sit in car without increase in aggravating symptoms;    Currently in Pain? Yes   Pain Score 8    Pain Location Back   Pain Orientation Left   Pain Type Chronic pain   Pain Onset More than a month ago       Treatment   Transverse abdominis marches in supine x 20 BLEs, min VCs for eccentric control  Pelvic rocks x 20 reps in hooklying, min tactile cues for increased ROM Hamstring AAROM stretch 3 sets x 20 seconds BLEs, min verbal cues for breathing technique  Lumbar rotations in hooklying to the R and back to midline x 20 reps  Prone press ups deferred due to pt complaints of shoulder discomfort  Inferential TENs to lumbar spine in sitting for 15 mins at tolerated intensity concurrent with cryotherapy, pt reported decrease in lumbar pain after TENs  HEP reviewed with addition of hamstring stretch and pelvic rocks                          PT Education - 11/18/15 1556    Education provided Yes   Education Details  addition of pelvic rocks and hamstring stretch to HEP, icing, importance of walking    Person(s) Educated Patient   Methods Explanation;Verbal cues   Comprehension Verbalized understanding;Returned demonstration;Verbal cues required             PT Long Term Goals - 11/09/15 1726    PT LONG TERM GOAL #1   Title Pt will report worst pain of 3/10 in low back to increase functional activites    Time 6   Period Weeks   Status New   PT LONG TERM GOAL #2   Title Pt will be indep picking up object off the floor with good body mechanics and without an increase in pain    Time 6   Period Weeks   Status New   PT LONG TERM GOAL #3   Title Pt will report decreased modified oswestry score to less than 20 percent to improve functional mobility    Time 6   Period Weeks   Status New   PT LONG TERM GOAL #4   Title Pt will be able to ride in car  for 2+ hours without increase in pain in order to travel to see family    Time 6   Period Weeks   Status New   PT LONG TERM GOAL #5   Title Pt will increase BLE grossly to 4/5 without increase in pain for easier sit to stand transfer    Time 6   Period Weeks   Status New               Plan - 11/18/15 1601    Clinical Impression Statement Pt had more overall pain today after her fall yesterday.  Pt was able to perform supine tranverse abdominis marches without an increase in pain. Pt performed pelvic tilts with min tactile cues for full ROM, which were added to her HEP.  Pt tolerated supine AAROM hamstring stretch without pain.  Pt reported that supine lumbar rotations were more comfortable on treatment table rather than her bed at home.  Pt did not tolerate prone positon well, inferential TENS was used in seated position with ice, pt reported a decrease in back pain after treatment.  She would continue to benefit from skilled physical therapy to increase lumbar range, core stability and decrease pain for greater functional mobility.     Rehab Potential Fair   Clinical Impairments Affecting Rehab Potential Positive indicator: motivated, negative indicator: age, comorbidites, chronic back pain, radicular symptoms, fall risk, clinical presentation: evolving-due to radicular symptoms related to back pain    PT Frequency 2x / week   PT Duration 6 weeks   PT Treatment/Interventions ADLs/Self Care Home Management;Cryotherapy;Electrical Stimulation;Moist Heat;Traction;Ultrasound;Stair training;Functional mobility training;Gait training;Therapeutic exercise;Therapeutic activities;Balance training;Neuromuscular re-education;Patient/family education;Manual techniques;Energy conservation;Aquatic Therapy   PT Next Visit Plan Nustep, review of HEP, core strengthening, extension preference exercises, TENs,    PT Home Exercise Plan see pt instructions, hamstring stretch and pelvic tilts were added     Consulted and Agree with Plan of Care Patient      Patient will benefit from skilled therapeutic intervention in order to improve the following deficits and impairments:  Pain, Hypomobility, Decreased endurance, Decreased balance, Decreased activity tolerance, Decreased strength, Decreased range of motion, Decreased mobility, Postural dysfunction, Abnormal gait, Improper body mechanics, Decreased safety awareness  Visit Diagnosis: Bilateral low back pain with left-sided sciatica  Muscle weakness (generalized)  Abnormal posture  Difficulty in walking, not elsewhere classified     Problem List There are  no active problems to display for this patient.  Stacy Gardner, SPT  This entire session was performed under direct supervision and direction of a licensed therapist/therapist assistant . I have personally read, edited and approve of the note as written.  Trotter,Margaret PT, DPT 11/18/2015, 4:20 PM  [place] MAIN [facility] SERVICES [address] [place], [place], [address] Phone: [phone]   Fax:  [phone]  Name: DANENE SANTAMARINA MRN: [id number] Date of Birth: Aug 30, 1940

## 2015-11-18 NOTE — Patient Instructions (Signed)
(  Home) Flexion: Pelvic Tilt    Lie with neck supported, knees bent, feet flat. Tighten and suck stomach in, pushing back down against surface. Do not push down with legs. Repeat _20___ times per set. Do 1____ sets per session. Do __5__ sessions per week.  Copyright  VHI. All rights reserved.  Hamstring Step 1    Straighten left knee. Keep knee level with other knee or on bolster. Hold __30_ seconds. Relax knee by returning foot to start. Repeat _3_ times. Perform on each leg   Copyright  VHI. All rights reserved.

## 2015-11-22 ENCOUNTER — Encounter: Payer: Self-pay | Admitting: Physical Therapy

## 2015-11-22 ENCOUNTER — Ambulatory Visit: Payer: Medicare Other | Admitting: Physical Therapy

## 2015-11-22 DIAGNOSIS — R262 Difficulty in walking, not elsewhere classified: Secondary | ICD-10-CM

## 2015-11-22 DIAGNOSIS — M5442 Lumbago with sciatica, left side: Secondary | ICD-10-CM

## 2015-11-22 DIAGNOSIS — R293 Abnormal posture: Secondary | ICD-10-CM

## 2015-11-22 DIAGNOSIS — M6281 Muscle weakness (generalized): Secondary | ICD-10-CM

## 2015-11-22 NOTE — Therapy (Signed)
[place] MAIN [facility] SERVICES [address] [place], [place], [address] Phone: [phone]   Fax:  [phone]  Physical Therapy Treatment  Patient Details  Name: Stacey Hayes MRN: [id number] Date of Birth: 1940-11-08 Referring Provider: Johny Blamer  Encounter Date: 11/22/2015      PT End of Session - 11/22/15 1514    Visit Number 4   Number of Visits 13   Date for PT Re-Evaluation 12/21/15   Authorization Type G code 4   Authorization Time Period 10   PT Start Time [id number]   PT Stop Time 1435   PT Time Calculation (min) 50 min   Activity Tolerance No increased pain;Patient limited by pain;Patient tolerated treatment well   Behavior During Therapy [facility] for tasks assessed/performed;Anxious      Past Medical History  Diagnosis Date  . Renal disorder   . Thyroid disease   . Immune deficiency disorder ([place])   . Cancer ([place])     5-6 years previous, lung cancer  . Hypertension     fluctuating, treated with meds  . Arthritis     knees    Past Surgical History  Procedure Laterality Date  . Abdominal hysterectomy    . Lung cyst Left     There were no vitals filed for this visit.      Subjective Assessment - 11/22/15 1511    Subjective Pt reports she was limited this weekend by her pain, and had to sit down frequently due to discomfort standing.  Today she reports pain in her knees, low back, and L hip.     Pertinent History Personal factors affecting rehab: Living alone, high fall risk, chronic back pain, arthritis, no railing on step to enter home,    Limitations Standing;Walking;Sitting   How long can you sit comfortably? 30 min.; if for an hour have problems moving afterwards   How long can you walk comfortably? reports not leaving the house unless she absolutely has to    Diagnostic tests MRI Mild canal stenosis L3-4 and L4-5. Neural foraminal narrowing L3-4 and L5-S1: Moderate to severe on the LEFT at L5-S1.   Patient Stated  Goals Wants to be able to travel near/far distances such as being able to sit in car without increase in aggravating symptoms;    Currently in Pain? Yes   Pain Score 5    Pain Location Back   Pain Onset More than a month ago        Treatment  Passive seated sciatic nerve glides, 3 sets x 10 reps BLE to decrease distal LE pain  Passive seated piriformis stretch to BLEs, 3 sets x 30 seconds, at this time pt unable to self stretch seated without increasing hip and LBP Standing hip extension with yellow theraband resistance, 15 reps each LE, min VCs to increase glut and quad activation and upright posture  AAROM seated sciatic nerve glides, 1 set x 10 reps BLE, min VCs to increase dorsiflexion and not to work through pain  Extensive education on purpose of sciatic nerve glides and piriformis stretch.     AAROM seated sciatic nerve glides and standing hip extensions with red theraband added to HEP                         PT Education - 11/22/15 1512    Education provided Yes   Education Details addition of HEP, importance of hip strengthening, sciatic nerve  Person(s) Educated Patient   Methods Explanation;Demonstration;Verbal cues   Comprehension Verbalized understanding;Returned demonstration             PT Long Term Goals - 11/09/15 1726    PT LONG TERM GOAL #1   Title Pt will report worst pain of 3/10 in low back to increase functional activites    Time 6   Period Weeks   Status New   PT LONG TERM GOAL #2   Title Pt will be indep picking up object off the floor with good body mechanics and without an increase in pain    Time 6   Period Weeks   Status New   PT LONG TERM GOAL #3   Title Pt will report decreased modified oswestry score to less than 20 percent to improve functional mobility    Time 6   Period Weeks   Status New   PT LONG TERM GOAL #4   Title Pt will be able to ride in car for 2+ hours without increase in pain in order to travel to  see family    Time 6   Period Weeks   Status New   PT LONG TERM GOAL #5   Title Pt will increase BLE grossly to 4/5 without increase in pain for easier sit to stand transfer    Time 6   Period Weeks   Status New               Plan - 11/22/15 1515    Clinical Impression Statement Pt reports LBP, bilateral hip and knee pain upon arrival.  She continues to report increased hip and LBP standing for extended periods of time.   Pt was able to tolerate seated sciatic nerve glides with a reported decrease in pain in BLEs standing.  Passive pirformis stretch to BLE was performed and pt was able to report feeling "looser" in her hips.  She was able to progress to standing hip extension with yellow theraband resistance which was initated into her HEP.  Pt  would continue to benefit from further skilled physical therapy to decrease hip and LBP,  and increase hip strength for greater functional mobility.     Rehab Potential Fair   Clinical Impairments Affecting Rehab Potential Positive indicator: motivated, negative indicator: age, comorbidites, chronic back pain, radicular symptoms, fall risk, clinical presentation: evolving-due to radicular symptoms related to back pain    PT Frequency 2x / week   PT Duration 6 weeks   PT Treatment/Interventions ADLs/Self Care Home Management;Cryotherapy;Electrical Stimulation;Moist Heat;Traction;Ultrasound;Stair training;Functional mobility training;Gait training;Therapeutic exercise;Therapeutic activities;Balance training;Neuromuscular re-education;Patient/family education;Manual techniques;Energy conservation;Aquatic Therapy   PT Next Visit Plan sciatic nerve glides, extension exercises, hip strengthening, self piriformis stretch    PT Home Exercise Plan added sciatic nerve glides,    Consulted and Agree with Plan of Care Patient      Patient will benefit from skilled therapeutic intervention in order to improve the following deficits and impairments:  Pain,  Hypomobility, Decreased endurance, Decreased balance, Decreased activity tolerance, Decreased strength, Decreased range of motion, Decreased mobility, Postural dysfunction, Abnormal gait, Improper body mechanics, Decreased safety awareness  Visit Diagnosis: No diagnosis found.     Problem List There are no active problems to display for this patient.  Stacy Gardner, SPT   Stacy Gardner 11/22/2015, 4:59 PM  [place] MAIN [facility] SERVICES [address] [place], [place], [address] Phone: [phone]   Fax:  [phone]  Name: Stacey Hayes MRN: [id number] Date  of Birth: 02/03/1941

## 2015-11-23 ENCOUNTER — Encounter: Payer: BLUE CROSS/BLUE SHIELD | Admitting: Physical Therapy

## 2015-11-24 ENCOUNTER — Ambulatory Visit: Payer: BLUE CROSS/BLUE SHIELD

## 2015-11-25 ENCOUNTER — Ambulatory Visit: Payer: Medicare Other | Admitting: Physical Therapy

## 2015-11-25 ENCOUNTER — Encounter: Payer: Self-pay | Admitting: Physical Therapy

## 2015-11-25 DIAGNOSIS — M5442 Lumbago with sciatica, left side: Secondary | ICD-10-CM | POA: Diagnosis not present

## 2015-11-25 DIAGNOSIS — R262 Difficulty in walking, not elsewhere classified: Secondary | ICD-10-CM

## 2015-11-25 DIAGNOSIS — M6281 Muscle weakness (generalized): Secondary | ICD-10-CM

## 2015-11-25 DIAGNOSIS — R293 Abnormal posture: Secondary | ICD-10-CM

## 2015-11-25 NOTE — Patient Instructions (Signed)
Hip Abduction: Standing Side Leg Lift (Eccentric)    Lift leg out to side quickly. Slowly lower for 3-5 seconds. Hold counter for support. Maintain good posture. Repeat 10 times each leg  http://ecce.exer.us/69   Copyright  VHI. All rights reserved.  EXTENSION: Standing - Resistance Band (Active)    Stand with upright posture, holding onto counter.  Kick leg back straight.  10 times for each leg.    http://gtsc.exer.us/83   Copyright  VHI. All rights reserved.  Piriformis Stretch    Lying on back, pull right knee toward opposite shoulder. Hold ___30_ seconds. Repeat ___3_ times. Do ___2_ sessions per day.  Try to pull knee over opposite leg   http://gt2.exer.us/258   Copyright  VHI. All rights reserved.

## 2015-11-25 NOTE — Therapy (Signed)
[place] MAIN [facility] SERVICES [address] [place], [place], [address] Phone: [phone]   Fax:  [phone]  Physical Therapy Treatment  Patient Details  Name: Stacey Hayes MRN: [id number] Date of Birth: 12-05-1940 Referring Provider: Johny Blamer  Encounter Date: 11/25/2015      PT End of Session - 11/25/15 1339    Visit Number 5   Number of Visits 13   Date for PT Re-Evaluation 12/21/15   Authorization Type G code 5   Authorization Time Period 10   PT Start Time 1120   PT Stop Time 1200   PT Time Calculation (min) 40 min   Activity Tolerance No increased pain;Patient tolerated treatment well   Behavior During Therapy [facility] for tasks assessed/performed      Past Medical History  Diagnosis Date  . Renal disorder   . Thyroid disease   . Immune deficiency disorder ([place])   . Cancer ([place])     5-6 years previous, lung cancer  . Hypertension     fluctuating, treated with meds  . Arthritis     knees    Past Surgical History  Procedure Laterality Date  . Abdominal hysterectomy    . Lung cyst Left     There were no vitals filed for this visit.      Subjective Assessment - 11/25/15 1125    Subjective Pt reports her back is feeling better today. She rates her back pain at 6/10.  Today, she reports complaints more in her knees than her back.       Pertinent History Personal factors affecting rehab: Living alone, high fall risk, chronic back pain, arthritis, no railing on step to enter home,    Limitations Standing;Walking;Sitting   How long can you sit comfortably? 30 min.; if for an hour have problems moving afterwards   How long can you walk comfortably? reports not leaving the house unless she absolutely has to    Diagnostic tests MRI Mild canal stenosis L3-4 and L4-5. Neural foraminal narrowing L3-4 and L5-S1: Moderate to severe on the LEFT at L5-S1.   Patient Stated Goals Wants to be able to travel near/far distances such as  being able to sit in car without increase in aggravating symptoms;    Currently in Pain? Yes   Pain Score 6    Pain Location Back   Pain Orientation Right;Left;Lower   Pain Descriptors / Indicators Tightness;Sore   Pain Type Chronic pain   Pain Onset More than a month ago   Pain Frequency Intermittent   Aggravating Factors  being stationary, lying down, standing    Pain Relieving Factors ice, movement helps          Treatment  Seated sciatic nerve glides performed by PT, 2 sets x 10 reps to BLEs, no increased pain and pt reported decrease in 6/10 to 5/10 pain in low back  Resisted standing hip abduction with yellow theraband, 2 sets x 10 reps BLEs, min VCs for glut and quad activation as well as upright posture  Resisted standing hip extension with yellow theraband, 2 sets x 10 reps BLEs, min VCs for quad activation and knee extension  Supine modified piriformis stretch, 2 sets x 30 seconds BLEs, pt able to pull opposite knee over opposite straight leg and maintain position, min tactile cues for positioning  Supine TrA straight leg, 2 sets x 10 reps BLEs, no increased pain, min VCs to maintain proper lumbar lordosis  Supine lumbar rotations to  both sides without pain, x 20 reps for increased mobility and pain relief   Reinforced HEP and instructed and added piriformis stretch standing hip abduction and standing hip extension.                         PT Education - 11/25/15 1339    Education provided Yes   Education Details additon to HEP, log rolling to get into and out of bed, importance of hip strength for knee stability and low back support    Person(s) Educated Patient   Methods Explanation;Demonstration;Verbal cues   Comprehension Verbalized understanding;Returned demonstration;Verbal cues required             PT Long Term Goals - 11/09/15 1726    PT LONG TERM GOAL #1   Title Pt will report worst pain of 3/10 in low back to increase functional  activites    Time 6   Period Weeks   Status New   PT LONG TERM GOAL #2   Title Pt will be indep picking up object off the floor with good body mechanics and without an increase in pain    Time 6   Period Weeks   Status New   PT LONG TERM GOAL #3   Title Pt will report decreased modified oswestry score to less than 20 percent to improve functional mobility    Time 6   Period Weeks   Status New   PT LONG TERM GOAL #4   Title Pt will be able to ride in car for 2+ hours without increase in pain in order to travel to see family    Time 6   Period Weeks   Status New   PT LONG TERM GOAL #5   Title Pt will increase BLE grossly to 4/5 without increase in pain for easier sit to stand transfer    Time 6   Period Weeks   Status New               Plan - 11/25/15 1340    Clinical Impression Statement Pt continues to report LBP and knee pain.  She reported less pain down BLEs and  the feeling of being "looser" by the end of the session.  Pt was able to perform hip extension and hip abduction with yellow theraband to progress her hip strength in order to provide knee and low back stability.  She was able to tolerate a modifed piriformis stretch in supine today which is a progression since last session.  With min VCs pt was able to progress TrA strengthening in supine without complaints of pain in knees or back.  Pt would continue to benefit from further skilled physical therapy to decrease pain levels, increase hip and core strengthening for greater functional mobility.     Rehab Potential Fair   Clinical Impairments Affecting Rehab Potential Positive indicator: motivated, negative indicator: age, comorbidites, chronic back pain, radicular symptoms, fall risk, clinical presentation: evolving-due to radicular symptoms related to back pain    PT Frequency 2x / week   PT Duration 6 weeks   PT Treatment/Interventions ADLs/Self Care Home Management;Cryotherapy;Electrical Stimulation;Moist  Heat;Traction;Ultrasound;Stair training;Functional mobility training;Gait training;Therapeutic exercise;Therapeutic activities;Balance training;Neuromuscular re-education;Patient/family education;Manual techniques;Energy conservation;Aquatic Therapy   PT Next Visit Plan sciatic nerve glides, extension exercises, hip strengthening, self piriformis stretch    PT Home Exercise Plan added resisted hip abduction in standing, modified piriformis stretch    Consulted and Agree with Plan of Care Patient  Patient will benefit from skilled therapeutic intervention in order to improve the following deficits and impairments:  Pain, Hypomobility, Decreased endurance, Decreased balance, Decreased activity tolerance, Decreased strength, Decreased range of motion, Decreased mobility, Postural dysfunction, Abnormal gait, Improper body mechanics, Decreased safety awareness  Visit Diagnosis: Bilateral low back pain with left-sided sciatica  Muscle weakness (generalized)  Abnormal posture  Difficulty in walking, not elsewhere classified     Problem List There are no active problems to display for this patient.  Stacy Gardner, SPT  This entire session was performed under direct supervision and direction of a licensed therapist/therapist assistant . I have personally read, edited and approve of the note as written.  Trotter,Margaret PT, DPT 11/25/2015, 3:12 PM  [place] MAIN [facility] SERVICES [address] [place], [place], [address] Phone: [phone]   Fax:  [phone]  Name: Stacey Hayes MRN: [id number] Date of Birth: 09-24-1940

## 2015-11-29 ENCOUNTER — Ambulatory Visit: Payer: Medicare Other | Admitting: Physical Therapy

## 2015-11-29 ENCOUNTER — Encounter: Payer: Self-pay | Admitting: Physical Therapy

## 2015-11-29 DIAGNOSIS — M5442 Lumbago with sciatica, left side: Secondary | ICD-10-CM

## 2015-11-29 DIAGNOSIS — R262 Difficulty in walking, not elsewhere classified: Secondary | ICD-10-CM

## 2015-11-29 DIAGNOSIS — R293 Abnormal posture: Secondary | ICD-10-CM

## 2015-11-29 DIAGNOSIS — M6281 Muscle weakness (generalized): Secondary | ICD-10-CM

## 2015-11-29 NOTE — Therapy (Signed)
[place] MAIN [facility] SERVICES [address] [place], [place], [address] Phone: [phone]   Fax:  [phone]  Physical Therapy Treatment  Patient Details  Name: Stacey Hayes MRN: [id number] Date of Birth: 08-08-1940 Referring Provider: Johny Blamer  Encounter Date: 11/29/2015      PT End of Session - 11/29/15 1614    Visit Number 6   Number of Visits 13   Date for PT Re-Evaluation 12/21/15   Authorization Type G code 6   Authorization Time Period 10   PT Start Time [id number]   PT Stop Time [id number]   PT Time Calculation (min) 34 min   Activity Tolerance No increased pain;Patient tolerated treatment well   Behavior During Therapy [facility] for tasks assessed/performed      Past Medical History  Diagnosis Date  . Renal disorder   . Thyroid disease   . Immune deficiency disorder ([place])   . Cancer ([place])     5-6 years previous, lung cancer  . Hypertension     fluctuating, treated with meds  . Arthritis     knees    Past Surgical History  Procedure Laterality Date  . Abdominal hysterectomy    . Lung cyst Left     There were no vitals filed for this visit.      Subjective Assessment - 11/29/15 1400    Subjective Pt reports that she  "hurts everywhere" even in her toes.     Pertinent History Personal factors affecting rehab: Living alone, high fall risk, chronic back pain, arthritis, no railing on step to enter home,    Limitations Standing;Walking;Sitting   How long can you sit comfortably? 30 min.; if for an hour have problems moving afterwards   How long can you walk comfortably? reports not leaving the house unless she absolutely has to    Diagnostic tests MRI Mild canal stenosis L3-4 and L4-5. Neural foraminal narrowing L3-4 and L5-S1: Moderate to severe on the LEFT at L5-S1.   Patient Stated Goals Wants to be able to travel near/far distances such as being able to sit in car without increase in aggravating symptoms;    Currently in  Pain? Yes   Pain Score 7    Pain Location Back   Pain Orientation Lower   Pain Descriptors / Indicators Aching   Pain Type Chronic pain   Pain Onset More than a month ago      Treatment   Childs pose stretch, 2 sets x 30 seconds to decrease pain and increase motion, min VCs for posture  Prone multifids single arm raise, 2 sets x 10, min VCs for neck positioning and directional lift  Sidelying clamshells, 3 sets x 10 reps BLEs, min tactile to lift knees towards the ceiling  Supine bridges, 2 sets x 10 reps, min VCs to weight bear through heels rather than toes for low back comfort  Self supine sciatic nerve glides, 2 sets x 10 reps BLEs, min VCs to increase ankle dorsiflexion   Added clamshells and sciatic nerve glides into HEP                              PT Education - 11/29/15 1613    Education provided Yes   Education Details addition to HEP, positions to avoid LE radicular symptoms     Person(s) Educated Patient   Methods Explanation;Demonstration;Verbal cues   Comprehension Verbalized understanding;Returned demonstration;Verbal cues required  PT Long Term Goals - 11/09/15 1726    PT LONG TERM GOAL #1   Title Pt will report worst pain of 3/10 in low back to increase functional activites    Time 6   Period Weeks   Status New   PT LONG TERM GOAL #2   Title Pt will be indep picking up object off the floor with good body mechanics and without an increase in pain    Time 6   Period Weeks   Status New   PT LONG TERM GOAL #3   Title Pt will report decreased modified oswestry score to less than 20 percent to improve functional mobility    Time 6   Period Weeks   Status New   PT LONG TERM GOAL #4   Title Pt will be able to ride in car for 2+ hours without increase in pain in order to travel to see family    Time 6   Period Weeks   Status New   PT LONG TERM GOAL #5   Title Pt will increase BLE grossly to 4/5 without increase in pain  for easier sit to stand transfer    Time 6   Period Weeks   Status New               Plan - 11/29/15 1615    Clinical Impression Statement Pt reports pain in all her joints today but that her back is getting better.  Pt was able to tolerate prone for the first session today and performed single arm lifts without increased pain.  Sidelying clamshells were performed for hip stability and initated into HEP.  Pt was able to tolerate supine bridges without pain today which is an improvement from previous sessions. Pt reported a decrease in LBP from 7/10 to 5/10 by the end of the session.   Pt was educated on importance of positioning herself appropriately and performing sciatic nerve glides while she is out of town so that she does not experience increased pain.  Pt would benefit from further skilled physical therapy to decrease pain and increase hip strengthening to promote greater functional mobility.     Rehab Potential Fair   Clinical Impairments Affecting Rehab Potential Positive indicator: motivated, negative indicator: age, comorbidites, chronic back pain, radicular symptoms, fall risk, clinical presentation: evolving-due to radicular symptoms related to back pain    PT Frequency 2x / week   PT Duration 6 weeks   PT Treatment/Interventions ADLs/Self Care Home Management;Cryotherapy;Electrical Stimulation;Moist Heat;Traction;Ultrasound;Stair training;Functional mobility training;Gait training;Therapeutic exercise;Therapeutic activities;Balance training;Neuromuscular re-education;Patient/family education;Manual techniques;Energy conservation;Aquatic Therapy   PT Next Visit Plan advance clamshells, SLR, lateral band walking    PT Home Exercise Plan added sidelying clamshells and supine sciatic nerve glide    Consulted and Agree with Plan of Care Patient      Patient will benefit from skilled therapeutic intervention in order to improve the following deficits and impairments:  Pain,  Hypomobility, Decreased endurance, Decreased balance, Decreased activity tolerance, Decreased strength, Decreased range of motion, Decreased mobility, Postural dysfunction, Abnormal gait, Improper body mechanics, Decreased safety awareness  Visit Diagnosis: Bilateral low back pain with left-sided sciatica  Muscle weakness (generalized)  Abnormal posture  Difficulty in walking, not elsewhere classified     Problem List There are no active problems to display for this patient.  Stacy Gardner, SPT  This entire session was performed under direct supervision and direction of a licensed therapist/therapist assistant . I have personally read, edited and approve of  the note as written.  Trotter,Margaret PT, DPT 11/29/2015, 4:28 PM  [place] MAIN [facility] SERVICES [address] [place], [place], [address] Phone: [phone]   Fax:  [phone]  Name: LASHEA RUNGE MRN: [id number] Date of Birth: 10-Mar-1941

## 2015-11-29 NOTE — Patient Instructions (Signed)
Abduction: Clam (Eccentric) - Side-Lying    Lie on side with knees bent. Lift top knee, keeping feet together. Keep trunk steady. Slowly lower for 3-5 seconds. 1__0_ reps per set, _3__ sets per day, __5_ days per week.   http://ecce.exer.us/65   Copyright  VHI. All rights reserved.  Neurovascular: Sciatic Nerve Stretch - Supine    Lie on your back with one knee bent and another leg up in the air, pump your ankle back and forth and then place back down.  Repeat 10 times for each leg.   Copyright  VHI. All rights reserved.

## 2015-12-02 ENCOUNTER — Encounter: Payer: BLUE CROSS/BLUE SHIELD | Admitting: Physical Therapy

## 2015-12-08 ENCOUNTER — Encounter: Payer: BLUE CROSS/BLUE SHIELD | Admitting: Physical Therapy

## 2015-12-10 ENCOUNTER — Encounter: Payer: BLUE CROSS/BLUE SHIELD | Admitting: Physical Therapy

## 2015-12-13 ENCOUNTER — Encounter: Payer: Self-pay | Admitting: Physical Therapy

## 2015-12-13 ENCOUNTER — Ambulatory Visit: Payer: Medicare Other | Attending: Geriatric Medicine | Admitting: Physical Therapy

## 2015-12-13 DIAGNOSIS — R262 Difficulty in walking, not elsewhere classified: Secondary | ICD-10-CM | POA: Diagnosis present

## 2015-12-13 DIAGNOSIS — R293 Abnormal posture: Secondary | ICD-10-CM | POA: Diagnosis present

## 2015-12-13 DIAGNOSIS — M6281 Muscle weakness (generalized): Secondary | ICD-10-CM | POA: Insufficient documentation

## 2015-12-13 DIAGNOSIS — M5442 Lumbago with sciatica, left side: Secondary | ICD-10-CM | POA: Diagnosis present

## 2015-12-13 NOTE — Patient Instructions (Signed)
Abductor Strength: Bridge Pose (Strap)    Make strap wide enough to brace knees at hip width. No strap around knees.  Keep knees spread apart.  Perform 2 sets x 10 reps   Copyright  VHI. All rights reserved.

## 2015-12-13 NOTE — Therapy (Signed)
[facility] [address] [place], [place], [address] Phone: [phone]   Fax:  [phone]  Physical Therapy Treatment  Patient Details  Name: Margaretmary EddyDorothy L Janeway MRN: [id number] Date of Birth: 05/07/1941 Referring Provider: Charlaine DaltonKizer, John  Encounter Date: 12/13/2015      PT End of Session - 12/13/15 1254    Visit Number 7   Number of Visits 13   Date for PT Re-Evaluation 12/21/15   Authorization Type G code 7   Authorization Time Period 10   PT Start Time 1046   PT Stop Time 1115   PT Time Calculation (min) 29 min   Activity Tolerance No increased pain;Patient tolerated treatment well   [facility] assessed/performed      Past Medical History  Diagnosis Date  . Renal disorder   . Thyroid disease   . Immune deficiency disorder ([facility])   . Cancer ([facility])     5-6 years previous, lung cancer  . Hypertension     fluctuating, treated with meds  . Arthritis     knees    Past Surgical History  Procedure Laterality Date  . Abdominal hysterectomy    . Lung cyst Left     There were no vitals filed for this visit.      Subjective Assessment - 12/13/15 1050    Subjective Pt reports that the back hurts after she has been up and moving around for a long period of time but now, her knees are hurting more frequently.     Pertinent History Personal factors affecting rehab: Living alone, high fall risk, chronic back pain, arthritis, no railing on step to enter home,    Limitations Standing;Walking;Sitting   How long can you sit comfortably? 30 min.; if for an hour have problems moving afterwards   How long can you walk comfortably? reports not leaving the house unless she absolutely has to    Diagnostic tests MRI Mild canal stenosis L3-4 and L4-5. Neural foraminal narrowing L3-4 and L5-S1: Moderate to severe on the LEFT at L5-S1.   Patient Stated Goals Wants to be able to travel near/far distances such  as being able to sit in car without increase in aggravating symptoms;    Currently in Pain? Yes   Pain Score 2    Pain Location Back   Pain Orientation Lower   Pain Descriptors / Indicators --  discomfort    Pain Onset More than a month ago   Multiple Pain Sites Yes   Pain Score 8   Pain Location Knee   Pain Orientation Right;Left   Pain Descriptors / Indicators Jabbing   Pain Relieving Factors rubbing them       Treatment  Sidelying clamshells with red theraband resistance, 2 sets x 10 reps BLEs, min VCs to keep hips forward throughout exercise  Supine bridges, 2 sets x 10 reps, min VCs to keep weight in heels to reduce LBP and min VCs to increase glut activation  Double knee to chest with therapy ball in supine, 2 sets x 10 reps, pt reported some low back pain relief after exercise  Long arc quads seated on therapy ball, 2 sets x 10 reps, min VCs for eccentric control, upright posture and core stabilization  Leg press 110#, 2 sets x 10 reps, min VCs to keep knees seperated and decrease full knee extension   Reviewed HEP, took away prone press ups due to increased pain, added supine bridges  without resistance                            PT Education - 12/13/15 1254    Education provided Yes   Education Details addition of HEP, walking program   Person(s) Educated Patient   Methods Explanation;Demonstration;Verbal cues   Comprehension Verbalized understanding;Returned demonstration;Verbal cues required             PT Long Term Goals - 11/09/15 1726    PT LONG TERM GOAL #1   Title Pt will report worst pain of 3/10 in low back to increase functional activites    Time 6   Period Weeks   Status New   PT LONG TERM GOAL #2   Title Pt will be indep picking up object off the floor with good body mechanics and without an increase in pain    Time 6   Period Weeks   Status New   PT LONG TERM GOAL #3   Title Pt will report decreased modified oswestry  score to less than 20 percent to improve functional mobility    Time 6   Period Weeks   Status New   PT LONG TERM GOAL #4   Title Pt will be able to ride in car for 2+ hours without increase in pain in order to travel to see family    Time 6   Period Weeks   Status New   PT LONG TERM GOAL #5   Title Pt will increase BLE grossly to 4/5 without increase in pain for easier sit to stand transfer    Time 6   Period Weeks   Status New               Plan - 12/13/15 1255    Clinical Impression Statement Pt reports less radicular symptoms today in her LEs and reports good compliance with her HEP.  Discussed in detail a walking program for home so that she walks in shorter increments and avoids increasing her LBP.  Pt was able to progress to supine bridges and sidelying clamshells with red theraband resistance without an increase in back or knee pain.  Pt was able to perform long arc quads on therapy ball if therapy ball was supported by therapist and pt was given min VCs for posture and positioning.  Pt performed leg press for the first time without an increase in knee pain and reported enjoying the exercise.  She would continue to benefit from further LE, lumbar, and core strengthening to promote greater functional mobility.     Rehab Potential Fair   Clinical Impairments Affecting Rehab Potential Positive indicator: motivated, negative indicator: age, comorbidites, chronic back pain, radicular symptoms, fall risk, clinical presentation: evolving-due to radicular symptoms related to back pain    PT Frequency 2x / week   PT Duration 6 weeks   PT Treatment/Interventions ADLs/Self Care Home Management;Cryotherapy;Electrical Stimulation;Moist Heat;Traction;Ultrasound;Stair training;Functional mobility training;Gait training;Therapeutic exercise;Therapeutic activities;Balance training;Neuromuscular re-education;Patient/family education;Manual techniques;Energy conservation;Aquatic Therapy   PT Next  Visit Plan therapy ball core marches,  leg press, bridges,    PT Home Exercise Plan see pt instructions, added supine  bridges    Consulted and Agree with Plan of Care Patient      Patient will benefit from skilled therapeutic intervention in order to improve the following deficits and impairments:  Pain, Hypomobility, Decreased endurance, Decreased balance, Decreased activity tolerance, Decreased strength, Decreased range of motion, Decreased mobility, Postural dysfunction, Abnormal  gait, Improper body mechanics, Decreased safety awareness  Visit Diagnosis: Bilateral low back pain with left-sided sciatica  Muscle weakness (generalized)  Abnormal posture  Difficulty in walking, not elsewhere classified     Problem List There are no active problems to display for this patient.  Stacy Gardner, SPT  This entire session was performed under direct supervision and direction of a licensed therapist/therapist assistant . I have personally read, edited and approve of the note as written.  Trotter,Margaret PT, DPT 12/13/2015, 1:10 PM  [place] MAIN [facility] SERVICES [address] [place], [place], [address] Phone: [phone]   Fax:  [phone]  Name: LOANY CARMACK MRN: [id number] Date of Birth: 12/16/1940

## 2015-12-20 ENCOUNTER — Ambulatory Visit: Payer: Medicare Other | Admitting: Physical Therapy

## 2015-12-20 DIAGNOSIS — M5442 Lumbago with sciatica, left side: Secondary | ICD-10-CM

## 2015-12-20 DIAGNOSIS — M6281 Muscle weakness (generalized): Secondary | ICD-10-CM

## 2015-12-20 DIAGNOSIS — R293 Abnormal posture: Secondary | ICD-10-CM

## 2015-12-20 DIAGNOSIS — R262 Difficulty in walking, not elsewhere classified: Secondary | ICD-10-CM

## 2015-12-20 NOTE — Therapy (Signed)
[place] MAIN [facility] SERVICES [address] [place], [place], [address] Phone: [phone]   Fax:  [phone]  Physical Therapy Treatment  Patient Details  Name: Stacey Hayes MRN: [id number] Date of Birth: 1941/04/03 Referring Provider: Johny Blamer  Encounter Date: 12/20/2015      PT End of Session - 12/20/15 1113    Visit Number 8   Number of Visits 13   Date for PT Re-Evaluation 12/21/15   Authorization Type G code 8   Authorization Time Period 10   PT Start Time 1020   PT Stop Time 1100   PT Time Calculation (min) 40 min   Activity Tolerance No increased pain;Patient tolerated treatment well   Behavior During Therapy [facility] for tasks assessed/performed      Past Medical History  Diagnosis Date  . Renal disorder   . Thyroid disease   . Immune deficiency disorder ([place])   . Cancer ([place])     5-6 years previous, lung cancer  . Hypertension     fluctuating, treated with meds  . Arthritis     knees    Past Surgical History  Procedure Laterality Date  . Abdominal hysterectomy    . Lung cyst Left     There were no vitals filed for this visit.      Subjective Assessment - 12/20/15 1111    Subjective Pt reports that she does not have back pain at this time but her knees are at a 8/10 pain bilaterally.  She reports increased pain if she is sitting for a long period of time.     Pertinent History Personal factors affecting rehab: Living alone, high fall risk, chronic back pain, arthritis, no railing on step to enter home,    Limitations Standing;Walking;Sitting   How long can you sit comfortably? 30 min.; if for an hour have problems moving afterwards   How long can you walk comfortably? reports not leaving the house unless she absolutely has to    Diagnostic tests MRI Mild canal stenosis L3-4 and L4-5. Neural foraminal narrowing L3-4 and L5-S1: Moderate to severe on the LEFT at L5-S1.   Patient Stated Goals Wants to be able to  travel near/far distances such as being able to sit in car without increase in aggravating symptoms;    Currently in Pain? Yes   Pain Score 8    Pain Location Knee   Pain Orientation Right;Left   Pain Descriptors / Indicators Aching   Pain Type Chronic pain   Pain Onset More than a month ago     Treatment  Quantrum leg press #110 BLEs, 2 sets x 10 reps, min VCs for eccentric control  Lateral sidestepping with red theraband resistance x 2 laps in both directions, min VCs to increase knee flexion and keep feet facing forwards  Cat camel was deferred due to pt complaints of knee pain in quadruped Lower trunk rotations x 20 reps, no increased pain in low back or knees  Supine bridges, 2 sets x 10 reps, min VCs to weight bear through heels to decrease stress on low back Supine modified piriformis stretch x 30 seconds BLEs, pt advised to perform 3 sets x 30 seconds at home for HEP  Supine AAROM hamstring stretch x 30 seconds BLEs, pt advised to perform 3 sets x 30 seconds at home for HEP  Sidelying clamshells with red theraband resistance, 2 sets x 10 reps BLEs, min VCs to keep feet together   Reeducated in  stretching program for home and lumbar roll for support while driving, sitting in home etc.                               PT Education - 12/20/15 1112    Education provided Yes   Education Details reinforced stretching routine in HEP, lumbar roll in the car    Person(s) Educated Patient   Methods Explanation;Demonstration;Verbal cues   Comprehension Verbalized understanding;Returned demonstration;Verbal cues required             PT Long Term Goals - 11/09/15 1726    PT LONG TERM GOAL #1   Title Pt will report worst pain of 3/10 in low back to increase functional activites    Time 6   Period Weeks   Status New   PT LONG TERM GOAL #2   Title Pt will be indep picking up object off the floor with good body mechanics and without an increase in pain    Time  6   Period Weeks   Status New   PT LONG TERM GOAL #3   Title Pt will report decreased modified oswestry score to less than 20 percent to improve functional mobility    Time 6   Period Weeks   Status New   PT LONG TERM GOAL #4   Title Pt will be able to ride in car for 2+ hours without increase in pain in order to travel to see family    Time 6   Period Weeks   Status New   PT LONG TERM GOAL #5   Title Pt will increase BLE grossly to 4/5 without increase in pain for easier sit to stand transfer    Time 6   Period Weeks   Status New               Plan - 12/20/15 1114    Clinical Impression Statement Pt reports no radiuclar symtpoms in LEs today and no back pain at this time.  After therapy, pts knee pain decreased from 8/10 to 6/10 and felt "looser".  Pt was able to progress hip strengthening to lateral band walks with red theraband resistance and bridges without an increase in knee pain.  Pt performed bilateral leg press which made pts knees feel "looser".  She is unable to tolerate exercises in quadruped due to discomfort in her knees.  She would continue to benefit from further skilled physical therapy to increase her quad, hip and low back strength to prevent reinjury and promote more functional independence.     Rehab Potential Fair   Clinical Impairments Affecting Rehab Potential Positive indicator: motivated, negative indicator: age, comorbidites, chronic back pain, radicular symptoms, fall risk, clinical presentation: evolving-due to radicular symptoms related to back pain    PT Frequency 2x / week   PT Duration 6 weeks   PT Treatment/Interventions ADLs/Self Care Home Management;Cryotherapy;Electrical Stimulation;Moist Heat;Traction;Ultrasound;Stair training;Functional mobility training;Gait training;Therapeutic exercise;Therapeutic activities;Balance training;Neuromuscular re-education;Patient/family education;Manual techniques;Energy conservation;Aquatic Therapy   PT Next  Visit Plan evaluate goals, therapy ball marches, leg press ,bridges    PT Home Exercise Plan added hamstring and pirifiromis stretch    Consulted and Agree with Plan of Care Patient      Patient will benefit from skilled therapeutic intervention in order to improve the following deficits and impairments:  Pain, Hypomobility, Decreased endurance, Decreased balance, Decreased activity tolerance, Decreased strength, Decreased range of motion, Decreased mobility, Postural dysfunction, Abnormal  gait, Improper body mechanics, Decreased safety awareness  Visit Diagnosis: Bilateral low back pain with left-sided sciatica  Muscle weakness (generalized)  Difficulty in walking, not elsewhere classified  Abnormal posture     Problem List There are no active problems to display for this patient.  Stacy Gardner, SPT   This entire session was performed under direct supervision and direction of a licensed therapist/therapist assistant . I have personally read, edited and approve of the note as written.  Trotter,Margaret PT, DPT 12/20/2015, 11:45 AM  [place] MAIN [facility] SERVICES [address] [place], [place], [address] Phone: [phone]   Fax:  [phone]  Name: Stacey Hayes MRN: [id number] Date of Birth: 02-12-1941

## 2015-12-27 ENCOUNTER — Encounter: Payer: Self-pay | Admitting: Physical Therapy

## 2015-12-27 ENCOUNTER — Ambulatory Visit: Payer: Medicare Other | Admitting: Physical Therapy

## 2015-12-27 DIAGNOSIS — M6281 Muscle weakness (generalized): Secondary | ICD-10-CM

## 2015-12-27 DIAGNOSIS — R262 Difficulty in walking, not elsewhere classified: Secondary | ICD-10-CM

## 2015-12-27 DIAGNOSIS — R293 Abnormal posture: Secondary | ICD-10-CM

## 2015-12-27 DIAGNOSIS — M5442 Lumbago with sciatica, left side: Secondary | ICD-10-CM

## 2015-12-27 NOTE — Addendum Note (Signed)
Addended by: Blanche East E on: 12/27/2015 11:32 AM   Modules accepted: Orders

## 2015-12-27 NOTE — Therapy (Signed)
[place] MAIN [facility] SERVICES [address] [place], [place], [address] Phone: [phone]   Fax:  [phone]           Physical Therapy Treatment/Progress Note Patient Details  Name: Stacey Hayes MRN: [id number] Date of Birth: [birth year] Referring Provider: Johny Blamer  Encounter Date: 12/27/2015      PT End of Session - 12/27/15 0927    Visit Number 9   Number of Visits 17   Date for PT Re-Evaluation 01/24/16   Authorization Type G code 9   Authorization Time Period 10   PT Start Time 0810   PT Stop Time 0850   PT Time Calculation (min) 40 min   Activity Tolerance No increased pain;Patient tolerated treatment well   Behavior During Therapy [facility] for tasks assessed/performed      Past Medical History:  Diagnosis Date  . Arthritis    knees  . Cancer ([place])    5-6 years previous, lung cancer  . Hypertension    fluctuating, treated with meds  . Immune deficiency disorder ([place])   . Renal disorder   . Thyroid disease     Past Surgical History:  Procedure Laterality Date  . ABDOMINAL HYSTERECTOMY    . lung cyst Left     There were no vitals filed for this visit.      Subjective Assessment - 12/27/15 0909    Subjective Patient reports that her back pain has been improving. She reports increased BLE knee pain especially with prolonged sitting; Patient reports compliance with HEP but reports that sometimes she doesn't always feel like doing her exercises.    Pertinent History Personal factors affecting rehab: Living alone, high fall risk, chronic back pain, arthritis, no railing on step to enter home,    Limitations Standing;Walking;Sitting   How long can you sit comfortably? 30 min.; if for an hour have problems moving afterwards   How long can you walk comfortably? reports not leaving the house unless she absolutely has to    Diagnostic tests MRI Mild canal stenosis L3-4 and L4-5. Neural foraminal narrowing L3-4 and L5-S1:  Moderate to severe on the LEFT at L5-S1.   Patient Stated Goals Wants to be able to travel near/far distances such as being able to sit in car without increase in aggravating symptoms;    Currently in Pain? Yes   Pain Score 7    Pain Location Knee   Pain Orientation Right;Left   Pain Descriptors / Indicators Aching   Pain Type Chronic pain   Pain Onset More than a month ago   Pain Frequency Intermittent   Pain Score 2   Pain Location Back   Pain Orientation Lower   Pain Descriptors / Indicators Aching;Sore   Pain Type Chronic pain            [facility] PT Assessment - 12/27/15 0001      Observation/Other Assessments   Modified [place] 34% (moderate disability) improved from 11/11/15 which was 56%      AROM   Lumbar Flexion 45   Lumbar Extension 20   Lumbar - Right Side Bend 15   Lumbar - Left Side Bend 15     Strength   Right Hip Flexion 4-/5   Right Hip Extension 3+/5   Left Hip Flexion 3+/5   Left Hip Extension 3+/5   Right Knee Flexion 4+/5   Right Knee Extension 4+/5   Left Knee Flexion 4/5   Left Knee Extension 4+/5  Right Ankle Dorsiflexion 4+/5   Left Ankle Dorsiflexion 4+/5       TREATMENT: Warm up on Nustep BUE/BLE x3 min (unbilled);  PT instructed patient in outcome measures including assessing lumbar ROM, BLE strength and modified oswestry patient demonstrates slight improvement in lumbar ROM particularly with lateral flexion; She also demonstrates improved LE strength, however continues to have weakness in BLE hips;  PT educated patient in progress.  PT instructed patient in sitting lumbopelvic rocks anterior/posterior rocks with moderate tactile and verbal cues to initiate movement with diaphragmatic breathing x10 reps; Patient reports no increase in pain with exercise;  Educated patient in walking program with instruction to purposefully walk for 15-20 min daily to address LE knee discomfort with arthritis.                         PT Education - 12/27/15 [phone]    Education provided Yes   Education Details Re-educated patient in lumbar/pelvic movement;    Person(s) Educated Patient   Methods Explanation;Verbal cues   Comprehension Verbalized understanding;Verbal cues required             PT Long Term Goals - 12/27/15 0931      PT LONG TERM GOAL #1   Title Pt will report worst pain of 3/10 in low back to increase functional activites    Time 6   Period Weeks   Status Partially Met     PT LONG TERM GOAL #2   Title Pt will be indep picking up object off the floor with good body mechanics and without an increase in pain    Time 6   Period Weeks   Status Not Met     PT LONG TERM GOAL #3   Title Pt will report decreased modified oswestry score to less than 20 percent to improve functional mobility    Time 6   Period Weeks   Status Partially Met     PT LONG TERM GOAL #4   Title Pt will be able to ride in car for 2+ hours without increase in pain in order to travel to see family    Time 6   Period Weeks   Status Achieved     PT LONG TERM GOAL #5   Title Pt will increase BLE grossly to 4/5 without increase in pain for easier sit to stand transfer    Time 6   Period Weeks   Status Partially Met               Plan - 12/27/15 6767    Clinical Impression Statement Instructed patient in outcome measures to assess progress towards goals. She demonstrates improved lumbar ROM especially with lateral flexion. Patient also demonstrates improved LE strength. However patient continues to have weakness in BLE hips. Patient reports increased BLE knee pain which is likely related to osteoarthritis. PT educated patient in home walking program. Patient would benefit from additional skilled PT intervention to address weakness and work on lifting/body mechanics for improved mobility;    Rehab Potential Fair   Clinical Impairments Affecting Rehab Potential Positive indicator: motivated, negative indicator: age,  comorbidites, chronic back pain, radicular symptoms, fall risk, clinical presentation: evolving-due to radicular symptoms related to back pain    PT Frequency 2x / week   PT Duration 6 weeks   PT Treatment/Interventions ADLs/Self Care Home Management;Cryotherapy;Electrical Stimulation;Moist Heat;Traction;Ultrasound;Stair training;Functional mobility training;Gait training;Therapeutic exercise;Therapeutic activities;Balance training;Neuromuscular re-education;Patient/family education;Manual techniques;Energy conservation;Aquatic Therapy   PT Next  Visit Plan work on Economist, lifting techniques, strengthening;    PT Home Exercise Plan educated in walking program and pelvic rocks;    Consulted and Agree with Plan of Care Patient      Patient will benefit from skilled therapeutic intervention in order to improve the following deficits and impairments:  Pain, Hypomobility, Decreased endurance, Decreased balance, Decreased activity tolerance, Decreased strength, Decreased range of motion, Decreased mobility, Postural dysfunction, Abnormal gait, Improper body mechanics, Decreased safety awareness  Visit Diagnosis: Bilateral low back pain with left-sided sciatica  Muscle weakness (generalized)  Abnormal posture  Difficulty in walking, not elsewhere classified     Problem List There are no active problems to display for this patient.   Trotter,Margaret PT, DPT 12/27/2015, 9:34 AM  [place] MAIN [facility] SERVICES [address] [place], [place], [address] Phone: [phone]   Fax:  [phone]  Name: Stacey Hayes MRN: [id number] Date of Birth: 07/13/1940

## 2016-01-03 ENCOUNTER — Ambulatory Visit: Payer: Medicare Other | Admitting: Physical Therapy

## 2016-01-03 ENCOUNTER — Encounter: Payer: Self-pay | Admitting: Physical Therapy

## 2016-01-03 DIAGNOSIS — M6281 Muscle weakness (generalized): Secondary | ICD-10-CM

## 2016-01-03 DIAGNOSIS — M5442 Lumbago with sciatica, left side: Secondary | ICD-10-CM | POA: Diagnosis not present

## 2016-01-03 DIAGNOSIS — R262 Difficulty in walking, not elsewhere classified: Secondary | ICD-10-CM

## 2016-01-03 DIAGNOSIS — R293 Abnormal posture: Secondary | ICD-10-CM

## 2016-01-03 NOTE — Therapy (Signed)
[place] MAIN [facility] SERVICES [address] [place], [place], [address] Phone: [phone]   Fax:  [phone]  Physical Therapy Treatment  Patient Details  Name: Stacey Hayes MRN: [id number] Date of Birth: 13-Apr-1941 Referring Provider: Johny Blamer  Encounter Date: 01/03/2016      PT End of Session - 01/03/16 1255    Visit Number 10   Number of Visits 17   Date for PT Re-Evaluation 01/24/16   Authorization Type G code 10   Authorization Time Period 10   PT Start Time 0940   PT Stop Time 1015   PT Time Calculation (min) 35 min   Equipment Utilized During Treatment Gait belt   Activity Tolerance No increased pain;Patient tolerated treatment well   Behavior During Therapy [facility] for tasks assessed/performed      Past Medical History:  Diagnosis Date  . Arthritis    knees  . Cancer ([place])    5-6 years previous, lung cancer  . Hypertension    fluctuating, treated with meds  . Immune deficiency disorder ([place])   . Renal disorder   . Thyroid disease     Past Surgical History:  Procedure Laterality Date  . ABDOMINAL HYSTERECTOMY    . lung cyst Left     There were no vitals filed for this visit.      Subjective Assessment - 01/03/16 0943    Subjective Pt reports that her back is feeling better today but her knees continue to bother her.     Pertinent History Personal factors affecting rehab: Living alone, high fall risk, chronic back pain, arthritis, no railing on step to enter home,    Limitations Standing;Walking;Sitting   How long can you sit comfortably? 30 min.; if for an hour have problems moving afterwards   How long can you walk comfortably? reports not leaving the house unless she absolutely has to    Diagnostic tests MRI Mild canal stenosis L3-4 and L4-5. Neural foraminal narrowing L3-4 and L5-S1: Moderate to severe on the LEFT at L5-S1.   Patient Stated Goals Wants to be able to travel near/far distances such as being  able to sit in car without increase in aggravating symptoms;    Currently in Pain? Yes   Pain Score 3    Pain Location Back   Pain Orientation Right;Left   Pain Descriptors / Indicators Aching   Pain Onset More than a month ago   Multiple Pain Sites Yes   Pain Score 7   Pain Location Knee   Pain Orientation Right;Left   Pain Descriptors / Indicators Aching;Sore   Pain Type Chronic pain      Treatment  Prior to exercise: Reviewed lifting techniques including deep squat and lunged position.  Instructed pt in importance of approaching object from the front and not twisting and bending at the same time.  Pt was able to perform lift from floor and raised surface with good technique through teach back method.  Also discussed methods of making her bed without straining her back.    Following lifting education: Leg press #90 BLEs, 2 sets x 10 reps, min VCS for slow eccentric control, no increase in knee pain during leg press Lateral step downs from 4 inch step, 2 sets x 10 reps leading with BLEs, 2 HHA, min VCs to perform like sitting in a chair  Monster walk forwards/backwards x 2 laps in both directions with red theraband resistance, no LOB, min VCs for technique and larger  steps  Wall squats with therapy ball support, 1 set x 10 reps, min VCs for posture and starting position.    Pt reported decrease in overall knee pain after treatment.  Encouraged pt to continue with walking program in small increments at home.                            PT Education - 01/03/16 1254    Education provided Yes   Education Details educated on lifting technique, reeducated on walking program    Person(s) Educated Patient   Methods Explanation;Demonstration;Verbal cues   Comprehension Verbalized understanding;Returned demonstration;Verbal cues required             PT Long Term Goals - 12/27/15 0931      PT LONG TERM GOAL #1   Title Pt will report worst pain of 3/10 in low  back to increase functional activites    Time 4   Period Weeks   Status Partially Met     PT LONG TERM GOAL #2   Title Pt will be indep picking up object off the floor with good body mechanics and without an increase in pain    Time 4   Period Weeks   Status Not Met     PT LONG TERM GOAL #3   Title Pt will report decreased modified oswestry score to less than 20 percent to improve functional mobility    Time 4   Period Weeks   Status Partially Met     PT LONG TERM GOAL #4   Title Pt will be able to ride in car for 2+ hours without increase in pain in order to travel to see family    Time 4   Period Weeks   Status Achieved     PT LONG TERM GOAL #5   Title Pt will increase BLE grossly to 4/5 without increase in pain for easier sit to stand transfer    Time 4   Period Weeks   Status Partially Met               Plan - 01/03/16 1256    Clinical Impression Statement Pt continues to report that her knee pain bothers her more than her low back.  She was reeducated on home walking program and instructed in proper lifting techniques to prevent reinjury. Pt was able to advance quad strengthening with leg press and wall squats without an increase in knee pain with good form.  With min VCs for technqiue pt performed monster walks over beam and lateral step downs to increase hip strengthening.  She was encouraged to continue HEP and stretches for greatest benefit at home.  She would continue to benefit from skilled PT to increase hip, and low back strength to decrease overall knee pain and increase mobility.     Rehab Potential Fair   Clinical Impairments Affecting Rehab Potential Positive indicator: motivated, negative indicator: age, comorbidites, chronic back pain, radicular symptoms, fall risk, clinical presentation: evolving-due to radicular symptoms related to back pain    PT Frequency 1x / week   PT Duration 4 weeks   PT Treatment/Interventions ADLs/Self Care Home  Management;Cryotherapy;Electrical Stimulation;Moist Heat;Traction;Ultrasound;Stair training;Functional mobility training;Gait training;Therapeutic exercise;Therapeutic activities;Balance training;Neuromuscular re-education;Patient/family education;Manual techniques;Energy conservation;Aquatic Therapy   PT Next Visit Plan hip, glut strengthening, review walking program    PT Home Exercise Plan educated in walking program and pelvic rocks;    Consulted and Agree with Plan of Care  Patient      Patient will benefit from skilled therapeutic intervention in order to improve the following deficits and impairments:  Pain, Hypomobility, Decreased endurance, Decreased balance, Decreased activity tolerance, Decreased strength, Decreased range of motion, Decreased mobility, Postural dysfunction, Abnormal gait, Improper body mechanics, Decreased safety awareness  Visit Diagnosis: Muscle weakness (generalized)  Bilateral low back pain with left-sided sciatica  Abnormal posture  Difficulty in walking, not elsewhere classified       G-Codes - 01-30-16 1433    Functional Assessment Tool Used strength, ROM, clinical judgement, modified oswestry   Functional Limitation Mobility: Walking and moving around   Mobility: Walking and Moving Around Current Status [phone]) At least 20 percent but less than 40 percent impaired, limited or restricted   Mobility: Walking and Moving Around Goal Status [phone]) At least 1 percent but less than 20 percent impaired, limited or restricted      Problem List There are no active problems to display for this patient.  Stacy Gardner, SPT  This entire session was performed under direct supervision and direction of a licensed therapist/therapist assistant . I have personally read, edited and approve of the note as written.  Trotter,Margaret PT, DPT Jan 30, 2016, 2:35 PM  [place] MAIN [facility] SERVICES [address] [place],  [place], [address] Phone: [phone]   Fax:  [phone]  Name: CAYLEN KUWAHARA MRN: [id number] Date of Birth: 02/04/1941

## 2016-01-14 ENCOUNTER — Ambulatory Visit: Payer: Medicare Other | Admitting: Physical Therapy

## 2016-01-20 ENCOUNTER — Ambulatory Visit: Payer: Medicare Other | Attending: Geriatric Medicine | Admitting: Physical Therapy

## 2016-01-27 ENCOUNTER — Ambulatory Visit: Payer: Medicare Other | Admitting: Physical Therapy

## 2018-02-25 ENCOUNTER — Encounter: Payer: Self-pay | Admitting: Physical Medicine & Rehabilitation

## 2018-03-06 ENCOUNTER — Encounter: Payer: Medicare Other | Admitting: Physical Medicine & Rehabilitation

## 2023-07-24 ENCOUNTER — Other Ambulatory Visit: Payer: Self-pay | Admitting: Psychiatry

## 2023-07-24 DIAGNOSIS — G4452 New daily persistent headache (NDPH): Secondary | ICD-10-CM

## 2023-07-24 DIAGNOSIS — R930 Abnormal findings on diagnostic imaging of skull and head, not elsewhere classified: Secondary | ICD-10-CM
